# Patient Record
Sex: Female | Born: 1985 | Race: White | Hispanic: No | Marital: Married | Smoking: Never smoker
Health system: Southern US, Community
[De-identification: ages and names within clinical notes are randomized; demographics above are authoritative.]

## PROBLEM LIST (undated history)

## (undated) DIAGNOSIS — N83209 Unspecified ovarian cyst, unspecified side: Secondary | ICD-10-CM

## (undated) DIAGNOSIS — F419 Anxiety disorder, unspecified: Secondary | ICD-10-CM

## (undated) DIAGNOSIS — R51 Headache: Secondary | ICD-10-CM

## (undated) DIAGNOSIS — F988 Other specified behavioral and emotional disorders with onset usually occurring in childhood and adolescence: Secondary | ICD-10-CM

## (undated) DIAGNOSIS — L708 Other acne: Secondary | ICD-10-CM

## (undated) DIAGNOSIS — Z87442 Personal history of urinary calculi: Secondary | ICD-10-CM

## (undated) HISTORY — DX: Other specified behavioral and emotional disorders with onset usually occurring in childhood and adolescence: F98.8

## (undated) HISTORY — DX: Other acne: L70.8

## (undated) HISTORY — DX: Unspecified ovarian cyst, unspecified side: N83.209

## (undated) HISTORY — PX: MOLE REMOVAL: SHX2046

## (undated) HISTORY — DX: Personal history of urinary calculi: Z87.442

## (undated) HISTORY — DX: Headache: R51

## (undated) HISTORY — PX: ABDOMINAL HYSTERECTOMY: SHX81

---
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
## 2005-01-12 ENCOUNTER — Ambulatory Visit: Payer: Self-pay | Admitting: Internal Medicine

## 2005-01-26 ENCOUNTER — Ambulatory Visit: Payer: Self-pay | Admitting: Internal Medicine

## 2005-04-23 ENCOUNTER — Ambulatory Visit: Payer: Self-pay | Admitting: Obstetrics and Gynecology

## 2005-07-19 ENCOUNTER — Ambulatory Visit: Payer: Self-pay | Admitting: Family Medicine

## 2005-08-12 HISTORY — PX: APPENDECTOMY: SHX54

## 2005-09-02 ENCOUNTER — Encounter (INDEPENDENT_AMBULATORY_CARE_PROVIDER_SITE_OTHER): Payer: Self-pay | Admitting: Specialist

## 2005-09-02 ENCOUNTER — Inpatient Hospital Stay (HOSPITAL_COMMUNITY): Admission: EM | Admit: 2005-09-02 | Discharge: 2005-09-03 | Payer: Self-pay | Admitting: Family Medicine

## 2005-11-04 ENCOUNTER — Ambulatory Visit: Payer: Self-pay | Admitting: Family Medicine

## 2006-03-21 ENCOUNTER — Ambulatory Visit: Payer: Self-pay | Admitting: Family Medicine

## 2006-06-24 ENCOUNTER — Ambulatory Visit: Payer: Self-pay | Admitting: Family Medicine

## 2006-06-27 ENCOUNTER — Ambulatory Visit: Payer: Self-pay | Admitting: Family Medicine

## 2006-06-27 LAB — CONVERTED CEMR LAB
BUN: 13 mg/dL (ref 6–23)
Basophils Absolute: 0 10*3/uL (ref 0.0–0.1)
Basophils Relative: 0.6 % (ref 0.0–1.0)
Creatinine, Ser: 1 mg/dL (ref 0.4–1.2)
Eosinophil percent: 0.5 % (ref 0.0–5.0)
HCT: 43.1 % (ref 36.0–46.0)
Hemoglobin: 14.7 g/dL (ref 12.0–15.0)
Lymphocytes Relative: 35.8 % (ref 12.0–46.0)
MCHC: 34.1 g/dL (ref 30.0–36.0)
MCV: 93.6 fL (ref 78.0–100.0)
Monocytes Absolute: 0.4 10*3/uL (ref 0.2–0.7)
Monocytes Relative: 5.7 % (ref 3.0–11.0)
Neutro Abs: 4.5 10*3/uL (ref 1.4–7.7)
Neutrophils Relative %: 57.4 % (ref 43.0–77.0)
Platelets: 185 10*3/uL (ref 150–400)
RBC: 4.6 M/uL (ref 3.87–5.11)
RDW: 11.4 % — ABNORMAL LOW (ref 11.5–14.6)
WBC: 7.7 10*3/uL (ref 4.5–10.5)

## 2006-07-07 ENCOUNTER — Ambulatory Visit: Payer: Self-pay | Admitting: Family Medicine

## 2006-08-08 ENCOUNTER — Ambulatory Visit: Payer: Self-pay | Admitting: Family Medicine

## 2006-09-09 ENCOUNTER — Emergency Department (HOSPITAL_COMMUNITY): Admission: EM | Admit: 2006-09-09 | Discharge: 2006-09-09 | Payer: Self-pay | Admitting: Emergency Medicine

## 2006-11-21 ENCOUNTER — Encounter: Payer: Self-pay | Admitting: Family Medicine

## 2006-11-21 DIAGNOSIS — N83209 Unspecified ovarian cyst, unspecified side: Secondary | ICD-10-CM

## 2006-11-21 DIAGNOSIS — L708 Other acne: Secondary | ICD-10-CM | POA: Insufficient documentation

## 2006-11-21 DIAGNOSIS — J309 Allergic rhinitis, unspecified: Secondary | ICD-10-CM | POA: Insufficient documentation

## 2006-11-21 DIAGNOSIS — Z87442 Personal history of urinary calculi: Secondary | ICD-10-CM

## 2006-11-24 ENCOUNTER — Ambulatory Visit: Payer: Self-pay | Admitting: Family Medicine

## 2006-11-24 DIAGNOSIS — F988 Other specified behavioral and emotional disorders with onset usually occurring in childhood and adolescence: Secondary | ICD-10-CM | POA: Insufficient documentation

## 2007-01-05 ENCOUNTER — Ambulatory Visit: Payer: Self-pay | Admitting: Family Medicine

## 2007-03-08 ENCOUNTER — Ambulatory Visit: Payer: Self-pay | Admitting: Family Medicine

## 2007-04-13 ENCOUNTER — Telehealth (INDEPENDENT_AMBULATORY_CARE_PROVIDER_SITE_OTHER): Payer: Self-pay | Admitting: *Deleted

## 2007-05-05 ENCOUNTER — Ambulatory Visit: Payer: Self-pay | Admitting: Family Medicine

## 2008-06-14 LAB — CONVERTED CEMR LAB: Pap Smear: NORMAL

## 2008-09-12 HISTORY — PX: DILATION AND CURETTAGE OF UTERUS: SHX78

## 2008-09-16 ENCOUNTER — Observation Stay: Payer: Self-pay | Admitting: Obstetrics and Gynecology

## 2008-10-28 ENCOUNTER — Ambulatory Visit: Payer: Self-pay | Admitting: Family Medicine

## 2008-10-29 LAB — CONVERTED CEMR LAB
ALT: 9 units/L (ref 0–35)
AST: 15 units/L (ref 0–37)
Albumin: 4.4 g/dL (ref 3.5–5.2)
Alkaline Phosphatase: 54 units/L (ref 39–117)
BUN: 11 mg/dL (ref 6–23)
Basophils Absolute: 0 10*3/uL (ref 0.0–0.1)
Basophils Relative: 0.5 % (ref 0.0–3.0)
Bilirubin, Direct: 0 mg/dL (ref 0.0–0.3)
CO2: 30 meq/L (ref 19–32)
Calcium: 9.5 mg/dL (ref 8.4–10.5)
Chloride: 111 meq/L (ref 96–112)
Cholesterol: 153 mg/dL (ref 0–200)
Creatinine, Ser: 0.9 mg/dL (ref 0.4–1.2)
Eosinophils Absolute: 0.1 10*3/uL (ref 0.0–0.7)
Eosinophils Relative: 0.8 % (ref 0.0–5.0)
GFR calc non Af Amer: 82.23 mL/min (ref >60)
Glucose, Bld: 84 mg/dL (ref 70–99)
HCT: 42.2 % (ref 36.0–46.0)
HDL: 81.1 mg/dL (ref >39.00)
Hemoglobin: 14.8 g/dL (ref 12.0–15.0)
LDL Cholesterol: 65 mg/dL (ref 0–99)
Lymphocytes Relative: 34.5 % (ref 12.0–46.0)
Lymphs Abs: 2.3 10*3/uL (ref 0.7–4.0)
MCHC: 35.1 g/dL (ref 30.0–36.0)
MCV: 94.8 fL (ref 78.0–100.0)
Monocytes Absolute: 0.3 10*3/uL (ref 0.1–1.0)
Monocytes Relative: 4.7 % (ref 3.0–12.0)
Neutro Abs: 3.9 10*3/uL (ref 1.4–7.7)
Neutrophils Relative %: 59.5 % (ref 43.0–77.0)
Platelets: 183 10*3/uL (ref 150.0–400.0)
Potassium: 4.3 meq/L (ref 3.5–5.1)
RBC: 4.45 M/uL (ref 3.87–5.11)
RDW: 11.3 % — ABNORMAL LOW (ref 11.5–14.6)
Sodium: 144 meq/L (ref 135–145)
TSH: 1.61 microintl units/mL (ref 0.35–5.50)
Total Bilirubin: 0.7 mg/dL (ref 0.3–1.2)
Total CHOL/HDL Ratio: 2
Total Protein: 7.4 g/dL (ref 6.0–8.3)
Triglycerides: 36 mg/dL (ref 0.0–149.0)
VLDL: 7.2 mg/dL (ref 0.0–40.0)
WBC: 6.6 10*3/uL (ref 4.5–10.5)

## 2008-10-31 ENCOUNTER — Telehealth: Payer: Self-pay | Admitting: Family Medicine

## 2008-11-06 ENCOUNTER — Encounter: Payer: Self-pay | Admitting: Family Medicine

## 2008-11-06 ENCOUNTER — Ambulatory Visit: Payer: Self-pay | Admitting: Psychiatry

## 2008-11-21 ENCOUNTER — Ambulatory Visit: Payer: Self-pay | Admitting: Psychiatry

## 2009-05-26 ENCOUNTER — Observation Stay: Payer: Self-pay | Admitting: Obstetrics and Gynecology

## 2009-06-10 ENCOUNTER — Observation Stay: Payer: Self-pay | Admitting: Obstetrics and Gynecology

## 2009-07-07 ENCOUNTER — Observation Stay: Payer: Self-pay | Admitting: Obstetrics and Gynecology

## 2009-07-15 ENCOUNTER — Observation Stay: Payer: Self-pay | Admitting: Obstetrics and Gynecology

## 2009-08-18 ENCOUNTER — Observation Stay: Payer: Self-pay

## 2009-08-20 ENCOUNTER — Observation Stay: Payer: Self-pay | Admitting: Obstetrics and Gynecology

## 2009-09-09 ENCOUNTER — Inpatient Hospital Stay: Payer: Self-pay | Admitting: Obstetrics and Gynecology

## 2009-12-23 ENCOUNTER — Ambulatory Visit: Payer: Self-pay | Admitting: Family Medicine

## 2010-02-18 ENCOUNTER — Ambulatory Visit: Payer: Self-pay | Admitting: Family Medicine

## 2010-02-18 DIAGNOSIS — R519 Headache, unspecified: Secondary | ICD-10-CM | POA: Insufficient documentation

## 2010-02-18 DIAGNOSIS — R51 Headache: Secondary | ICD-10-CM | POA: Insufficient documentation

## 2010-02-18 DIAGNOSIS — M542 Cervicalgia: Secondary | ICD-10-CM

## 2010-03-31 ENCOUNTER — Telehealth: Payer: Self-pay | Admitting: Family Medicine

## 2010-06-02 ENCOUNTER — Telehealth: Payer: Self-pay | Admitting: Family Medicine

## 2010-07-06 ENCOUNTER — Telehealth: Payer: Self-pay | Admitting: Family Medicine

## 2010-07-14 NOTE — Assessment & Plan Note (Signed)
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 Sudafed 2)  * Allegra D  Past History:  Past Medical History: Last updated: Nov 21, 2008 Allergic rhinitis Nephrolithiasis, hx of ADD endometriosis    gyn - Dr Logan Bores [facility])  Past Surgical History: Last updated: November 21, 2008 Appendectomy (08/2005) Mole removal - ? 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 Eyes:  Denies blurring. CV:  Denies chest pain or discomfort, palpitations, shortness of breath with exertion, and swelling of feet. Resp:  Denies cough and shortness of breath. GI:  Denies abdominal pain, change in bowel habits, and nausea. Derm:  Denies rash. Neuro:  Complains of difficulty with concentration; denies disturbances in coordination, falling down, headaches, memory loss, numbness, poor balance, and tingling. Psych:  Denies anxiety, depression, panic attacks, and sense of great danger. Endo:  Denies cold intolerance, excessive thirst, excessive urination, and heat intolerance. Heme:  Denies abnormal bruising and bleeding.  Physical Exam  General:  Well-developed,well-nourished,in no acute distress; alert,appropriate and cooperative throughout examination Head:  normocephalic, atraumatic, and no abnormalities observed.   Eyes:  vision grossly intact, pupils equal, pupils round, and pupils reactive to light.  no conjunctival pallor, injection or icterus  Nose:  no nasal discharge.   Mouth:  pharynx pink and moist.   Neck:  supple with full rom and no masses or thyromegally, no JVD or carotid bruit  Chest Wall:  No deformities, masses, or tenderness noted. Lungs:  Normal respiratory effort, chest expands symmetrically. Lungs are clear to auscultation, no crackles or wheezes. Heart:  Normal rate and regular rhythm. S1 and S2 normal without gallop, murmur, click, rub or other extra sounds. Msk:  No deformity or scoliosis noted of thoracic or lumbar spine.  no acute joint changes, full rom of all joints  Extremities:  No clubbing, cyanosis, edema, or deformity noted with normal full range of motion of all joints.   Neurologic:  sensation intact to light touch, gait normal, and DTRs  symmetrical and normal.  no tremor  Skin:  Intact without suspicious lesions or rashes Cervical Nodes:  No lymphadenopathy noted Psych:  cheerful affect - talkative  good eye contact not seemingly overactive    Impression & Recommendations:  Problem # 1:  ADD (ICD-314.00) Assessment Deteriorated ADD with some problems at work with innatentiveness/ productivity and multitasking  will start back on daytime adderall 20 mg xr - and update  adv to call if side eff or not eff she is familiar with process to refil/ etc   Complete Medication List: 1)  Adderall Xr 20 Mg Xr24h-cap (Amphetamine-dextroamphetamine) .Marland Kitchen.. 1 by mouth once daily  Patient Instructions: 1)  take adderall on work days- update me if side effects or problems 2)  call if not effective  Prescriptions: ADDERALL XR 20 MG XR24H-CAP (AMPHETAMINE-DEXTROAMPHETAMINE) 1 by mouth once daily  #30 x 0   Entered and Authorized by:   Judith Part MD   Signed by:   Judith Part MD on 12/23/2009   Method used:   Print then Give to Patient   RxID:   [id number]   Prior Medications (reviewed today): None Current Allergies (reviewed today): ! SUDAFED Joyce Copa D

## 2010-07-14 NOTE — Assessment & Plan Note (Signed)
Summary: ACU FOR HURT NECK CAN'T TURN HEAD TO SIDE/JRR   Vital Signs:  Patient profile:   25 year old female Height:      63.5 inches Weight:      149 pounds BMI:     26.07 Temp:     98.7 degrees F oral Pulse rate:   80 / minute Pulse rhythm:   regular BP sitting:   102 / 72  (left arm) Cuff size:   regular  Vitals Entered By: Linde Gillis CMA Duncan Dull) (February 18, 2010 12:17 PM) CC: pain in neck, hurts to turn her head, Headache   History of Present Illness: 25 year old female:  Neck and moving across.   neck:posterior neck pain, over the weekend, with some restriction of lateral rotational movements on the left. This is improved over the last few days, and the patient has been taking some Aleve. She does have a 47-month-old baby. She is not breast-feeding. no hx of neck pronbs  ? migraines  Headache HPI:      The patient comes in for an acute, first time visit for headaches.  The current headache started approximately 02/12/2010.  She notes 5+ previous headaches similar to the current one.  The headaches will last anywhere from 2 hours to 3 days at a time.  She has approximately 5+ headaches per month.  Headaches have been occurring since age 20.  There is no family history of migraine headaches.        The location of the headaches are occipital.  The headaches are associated with nausea, photophobia, and phonophobia.        Positive alarm features include change in frequency from prior H/A's, change in severity from prior H/A's, and change in features from prior H/A's.  The patient denies H/A's with Valsalva (cough/sneeze), mylagia, fever, malaise, weight loss, scalp tenderness, jaw claudication, focal neurologic symptoms, confusion, seizures, and impaired level of consciousness.        Allergies: 1)  ! Sudafed 2)  * Allegra D  Past History:  Past medical, surgical, family and social histories (including risk factors) reviewed, and no changes noted (except as noted  below).  Past Medical History: Reviewed history from 10/28/2008 and no changes required. Allergic rhinitis Nephrolithiasis, hx of ADD endometriosis    gyn - Dr Logan Bores [facility])  Past Surgical History: Reviewed history from 10/28/2008 and no changes required. Appendectomy (08/2005) Mole removal - ? atypical D and C 4/10 - miscarriage laparoscopy pelvic 06- endometriosis   Family History: Reviewed history from 10/28/2008 and no changes required. Father: Colon polyps,died from brain tumor Mother:depression Siblings: 1 brother, 1 sister  Social History: Reviewed history from 12/23/2009 and no changes required. 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 Otherwise the pertinent positives of the ROS are noted above.    Physical Exam  General:  GEN: Well-developed,well-nourished,in no acute distress; alert,appropriate and cooperative throughout examination HEENT: Normocephalic and atraumatic without obvious abnormalities. No apparent alopecia or balding. Ears, externally no deformities PULM: Breathing comfortably in no respiratory distress EXT: No clubbing, cyanosis, or edema PSYCH: Normally interactive. Cooperative during the interview. Pleasant. Friendly and conversant.  Not anxious or depressed appearing. Normal, full affect.  Msk:  cervical spine: Nontender along the spinous processes.  Some slight degree of restriction of motion with moving in the left work rotational motion. Mild paracervical muscular tenderness. No trapezius tenderness.  Scapular dyskinesis noted.  C5-T1 is intact.   Detailed Neurologic  Exam  Speech:    Speech is normal; fluent and spontaneous with normal comprehension Cognition:    The patient is oriented to person, place, and time; memory intact; language fluent; normal attention, concentration, and fund of knowledge Cranial Nerves:    The pupils are equal, round, and reactive to light. The fundi are normal and spontaneous venous pulsations are present. Visual fields are full to finger confrontation. Extraocular movements are intact. Trigeminal sensation is intact and the muscles of mastication are normal. The face is symmetric. The palate elevates in the midline. Voice is normal. Shoulder shrug is normal. The tongue has normal motion without fasciculations.  Coordination:    Normal finger to nose and heel to shin. Normal rapid alternating movements.  Gait:    Heel-toe and tandem gait are normal.  Light Touch:    Normal light touch sensation in upper and lower extremities.    Impression & Recommendations:  Problem # 1:  NECK PAIN (ICD-723.1) Assessment New cervicalgia, improving. Reviewed gentle range of motion protocols and some basic strengthening. Heat, massage. Flexeril at night. Continue with Aleve at home.  Headaches are acceptable. May be related to neck. Given photophobia and phonophobia, could have onset of migraine. For now with headache, Tylenol and NSAIDs with caffeine.  Her updated medication list for this problem includes:    Cyclobenzaprine Hcl 10 Mg Tabs (Cyclobenzaprine hcl) .Marland Kitchen... 1/2 to 1 tab by mouth at bedtime    Butalbital-apap-caffeine 50-325-40 Mg Tabs (Butalbital-apap-caffeine) .Marland Kitchen... 1-2 tabs by mouth as needed headache q 4 hours (max 6 / 24 hours)  Problem # 2:  HEADACHE (ICD-784.0) Assessment: New  Her updated medication list for this problem includes:    Butalbital-apap-caffeine 50-325-40 Mg Tabs (Butalbital-apap-caffeine) .Marland Kitchen... 1-2 tabs by mouth as needed headache q 4 hours (max 6 / 24 hours)  Complete Medication List: 1)  Adderall Xr  20 Mg Xr24h-cap (Amphetamine-dextroamphetamine) .Marland Kitchen.. 1 by mouth once daily 2)  Cyclobenzaprine Hcl 10 Mg Tabs (Cyclobenzaprine hcl) .... 1/2 to 1 tab by mouth at bedtime 3)  Butalbital-apap-caffeine 50-325-40 Mg Tabs (Butalbital-apap-caffeine) .Marland Kitchen.. 1-2 tabs by mouth as needed headache q 4 hours (max 6 / 24 hours) Prescriptions: BUTALBITAL-APAP-CAFFEINE 50-325-40 MG TABS (BUTALBITAL-APAP-CAFFEINE) 1-2 tabs by mouth as needed headache q 4 hours (max 6 / 24 hours)  #30 x 0   Entered and Authorized by:   Hannah Beat MD   Signed by:   Hannah Beat MD on 02/18/2010   Method used:   Print then Give to Patient   RxID:   [id number] CYCLOBENZAPRINE HCL 10 MG TABS (CYCLOBENZAPRINE HCL) 1/2 to 1 tab by mouth at bedtime  #30 x 0   Entered and Authorized by:   Hannah Beat MD   Signed by:   Hannah Beat MD on 02/18/2010   Method used:   Print then Give to Patient   RxID:   [id number]   Current Allergies (reviewed today): ! SUDAFED Joyce Copa D

## 2010-07-14 NOTE — Progress Notes (Signed)
Summary: Adderall Refill  Phone Note Refill Request Call back at Home Phone [phone] Message from:  Patient on March 31, 2010 11:08 AM  Refills Requested: Medication #1:  ADDERALL XR 20 MG XR24H-CAP 1 by mouth once daily Patient called and requested written script for refill.    Method Requested: Pick up at Office Initial call taken by: Selena Batten Dance CMA Duncan Dull),  March 31, 2010 11:08 AM  Follow-up for Phone Call        printed in put in nurse in box for pickup   Follow-up by: Judith Part MD,  March 31, 2010 1:16 PM  Additional Follow-up for Phone Call Additional follow up Details #1::        Patient notified as instructed by telephone. Prescription left at front desk. Lewanda Rife LPN  March 31, 2010 2:28 PM     Prescriptions: ADDERALL XR 20 MG XR24H-CAP (AMPHETAMINE-DEXTROAMPHETAMINE) 1 by mouth once daily  #30 x 0   Entered and Authorized by:   Judith Part MD   Signed by:   Judith Part MD on 03/31/2010   Method used:   Print then Give to Patient   RxID:   [id number]

## 2010-07-14 NOTE — Miscellaneous (Signed)
Summary: Controlled Substances Contract  Controlled Substances Contract   Imported By: Lester [place] 12/25/2009 11:57:06  _____________________________________________________________________  External Attachment:    Type:   Image     Comment:   External Document

## 2010-07-16 NOTE — Progress Notes (Signed)
Summary: refill request for adderall  Phone Note Refill Request Call back at Home Phone [phone] Message from:  Patient  Refills Requested: Medication #1:  ADDERALL XR 20 MG XR24H-CAP 1 by mouth once daily Pt is asking if dose can be increased.  She says some days she cant tell that she has taken it.  States dose was recently decreased.  Initial call taken by: Lowella Petties CMA, AAMA,  July 06, 2010 2:36 PM  Follow-up for Phone Call        what dose was she previously on that worked better for her ? Follow-up by: Judith Part MD,  July 06, 2010 4:18 PM  Additional Follow-up for Phone Call Additional follow up Details #1::        Left message for patient to call back. Lewanda Rife LPN  July 06, 2010 4:27 PM   Patient notified as instructed by telephone. Pt stated she thinks she took the Adderall XR 30mg  takinig one daily.Please advise. Lewanda Rife LPN  July 07, 2010 8:25 AM     Additional Follow-up for Phone Call Additional follow up Details #2::    that is fine - let me know if not improved with it  printed in put in nurse in box for pickup  Follow-up by: Judith Part MD,  July 07, 2010 8:44 AM  Additional Follow-up for Phone Call Additional follow up Details #3:: Details for Additional Follow-up Action Taken: Left message for patient to call back. Prescription left at front desk. Lewanda Rife LPN  July 07, 2010 10:38 AM   Patient notified as instructed by telephone. Lewanda Rife LPN  July 07, 2010 1:34 PM   New/Updated Medications: ADDERALL XR 30 MG XR24H-CAP (AMPHETAMINE-DEXTROAMPHETAMINE) 1 by mouth once daily Prescriptions: ADDERALL XR 30 MG XR24H-CAP (AMPHETAMINE-DEXTROAMPHETAMINE) 1 by mouth once daily  #30 x 0   Entered and Authorized by:   Judith Part MD   Signed by:   Judith Part MD on 07/07/2010   Method used:   Print then Give to Patient   RxID:   [id number]

## 2010-07-16 NOTE — Progress Notes (Signed)
Summary: refill request for adderall  Phone Note Refill Request Call back at Home Phone [phone] Message from:  Patient  Refills Requested: Medication #1:  ADDERALL XR 20 MG XR24H-CAP 1 by mouth once daily Please call pt when ready.  Initial call taken by: Lowella Petties CMA, AAMA,  June 02, 2010 11:32 AM  Follow-up for Phone Call        printed in put in nurse in box for pickup  Follow-up by: Judith Part MD,  June 02, 2010 12:57 PM  Additional Follow-up for Phone Call Additional follow up Details #1::        Patient notified that rx is up front and ready for pickup.  Additional Follow-up by: Sydell Axon LPN,  June 02, 2010 2:35 PM    Prescriptions: ADDERALL XR 20 MG XR24H-CAP (AMPHETAMINE-DEXTROAMPHETAMINE) 1 by mouth once daily  #30 x 0   Entered and Authorized by:   Judith Part MD   Signed by:   Judith Part MD on 06/02/2010   Method used:   Print then Give to Patient   RxID:   [id number]

## 2010-08-13 ENCOUNTER — Ambulatory Visit: Payer: Self-pay | Admitting: Anesthesiology

## 2010-08-21 ENCOUNTER — Ambulatory Visit: Payer: Self-pay | Admitting: Obstetrics and Gynecology

## 2010-09-03 ENCOUNTER — Telehealth: Payer: Self-pay | Admitting: *Deleted

## 2010-09-03 NOTE — Telephone Encounter (Signed)
Needs refill on adderall. Please call patient when rx is ready.

## 2010-09-04 MED ORDER — AMPHETAMINE-DEXTROAMPHET ER 30 MG PO CP24: 30.0000 mg | ORAL_CAPSULE | ORAL | Status: DC

## 2010-09-04 NOTE — Telephone Encounter (Signed)
Px printed for pick up in IN box  

## 2010-09-04 NOTE — Telephone Encounter (Signed)
Patient notified as instructed by telephone. Prescription left at front desk.  

## 2010-10-30 NOTE — H&P (Signed)
NAMESHEVY, YANEY           ACCOUNT NO.:  [id number]   MEDICAL RECORD NO.:  [id number]          PATIENT TYPE:  INP   LOCATION:  5729                         FACILITY:  [facility]   PHYSICIAN:  Wilmon Arms. Corliss Skains, M.D. DATE OF BIRTH:  January 21, 1986   DATE OF ADMISSION:  09/01/2005  DATE OF DISCHARGE:                                HISTORY & PHYSICAL   CHIEF COMPLAINT:  Right lower quadrant pain.   HISTORY OF PRESENT ILLNESS:  The patient is a 25 year old female with a past  medical history significant for endometriosis and ovarian cysts who presents  with 12 hours of acute onset of generalized lower abdominal pain. The pain  has now become localized to the right lower quadrant. She does have some  nausea but no vomiting. She reports low-grade temperature around 100 and has  no appetite. Her last meal was around noon yesterday. She presented to  Urgent Care and was transferred to the emergency department for a CT scan  for suspected appendicitis. A CT scan confirmed a retrocecal appendix with  inflammation but no evidence of abscess or free fluid. The patient was given  one dose of intravenous pain medication with significant relief of her  symptoms.   PAST MEDICAL HISTORY:  Endometriosis, ovarian cysts.   PAST SURGICAL HISTORY:  Laparoscopy in November 2006 by Dr. Logan Bores,  gynecologist in [address].   HOME MEDICATIONS:  None.   ALLERGIES:  1.  CODEINE, but the patient took Vicodin after her laparoscopy with no      difficulty.  2.  PSEUDOEPHEDRINE which gives her a fast heart rate.   PHYSICAL EXAMINATION:  VITAL SIGNS:  Temperature 100.0, blood pressure  122/83, pulse 88, respirations 20, 99% on room air.  GENERAL:  This is a well-developed, well-nourished female in no apparent  distress.  HEENT:  EOMI, sclerae anicteric.  NECK:  No masses or thyromegaly.  LUNGS:  Clear to auscultation bilaterally. Normal respiratory effort.  HEART:  Regular rate and rhythm, no murmurs.  ABDOMEN:  Soft, good bowel sounds. Tender in the right lower quadrant. No  palpable masses. There is some voluntary guarding.  PELVIC:  Examination was performed by the emergency department physician and  shows some right-sided tenderness.  SKIN:  Warm and dry. No sign of jaundice.   LABORATORY DATA:  White count is 11.7, hemoglobin 14.4.   IMPRESSION:  Acute retrocecal appendicitis.   PLAN:  Will take the patient to the operating room urgently for a  laparoscopic appendectomy. We discussed the benefits and risks of the  procedure including the possible need for an open procedure. The patient and  her mother understand and wish to proceed.      Wilmon Arms. Tsuei, M.D.  Electronically Signed     MKT/MEDQ  D:  09/02/2005  T:  09/02/2005  Job:  [id number]

## 2010-10-30 NOTE — Op Note (Signed)
NAMEPORSHEA, JANOWSKI           ACCOUNT NO.:  [id number]   MEDICAL RECORD NO.:  [id number]          PATIENT TYPE:  INP   LOCATION:  2550                         FACILITY:  [facility]   PHYSICIAN:  Wilmon Arms. Corliss Skains, M.D. DATE OF BIRTH:  05/07/1986   DATE OF PROCEDURE:  09/02/2005  DATE OF DISCHARGE:                                 OPERATIVE REPORT   PREOP DIAGNOSIS:  Acute appendicitis.   POSTOP DIAGNOSIS:  Acute appendicitis.   PROCEDURE PERFORMED:  Laparoscopic appendectomy   SURGEON:  Wilmon Arms. Tsuei, M.D.   ANESTHESIA:  General endotracheal.   INDICATIONS:  The patient is a 25 year old female who presents with 12 hours  of acute right lower quadrant pain. She was evaluated in the emergency  department and had a CT scan showing acute appendicitis and a retrocecal  appendix. There was no evidence of abscess.  Surgery was then consulted. I examined the patient and recommended immediate  laparoscopic appendectomy.   DESCRIPTION OF PROCEDURE:  The patient brought to the operating room, placed  in the supine position on the operating table. After an adequate level of  general endotracheal anesthesia was obtained, a Foley catheter was placed in  a sterile technique. The patient's abdomen was prepped with Betadine and  draped in a sterile fashion. A time-out was taken to ensure the proper  patient and the proper procedure. The patient's  umbilicus was infiltrated  with 1/4% Marcaine; and a transverse incision was made.   Dissection was carried down to the fascia. The fascia was opened vertically.  The peritoneal cavity was bluntly entered. A stay suture of #0 Vicryl was  placed in a pursestring fashion. The Hasson cannula was inserted and secured  with a stay suture. Pneumoperitoneum was obtained by insufflating CO2,  maintaining a maximum pressure of 15 mmHg. The laparoscope was inserted and  a quick visual examination showed no purulence anywhere in the abdomen. A 10-  mm port  was placed in the upper midline under direct vision. A 5-mm port was  placed in the left lower quadrant. The scope was switched out for a 30-  degree, 10-mm scope and inserted through the upper port.   The patient was rotated into the Trendelenburg position, tilted slightly to  her left. The cecum was fairly mobile. Babcock clamp was used to pull the  cecum medially. We were able to identify the tip of the appendix. This  appeared mildly inflamed. This was grasped with a Babcock clamp and  elevated. There were some flimsy adhesions to the appendix. These were taken  down with the harmonic scalpel. We continued mobilizing the appendix toward  its base. The mesoappendix was divided with the harmonic scalpel. Once we  had the appendix dissected free all the way down to its base an Endo-GIA  stapler was used to transect the base of appendix. The appendix was then  placed in an EndoCatch sac and removed through the umbilical port. We  reinspected the staple line which was noted to be intact with no sign of  bleeding. The right lower quadrant was irrigated and suctioned dry. No other  inflammation  was noted.   The ports were removed under direct vision. Pneumoperitoneum was released,  and the stay suture was tied down to close the umbilical fascia. Then 4-0  Monocryl was used to close skin in subcuticular fashion. Steri-Strips and  clean dressings were applied. The patient was extubated and brought to  recovery room in stable condition. Her Foley catheter was removed prior to  leaving the operating room.      Wilmon Arms. Tsuei, M.D.  Electronically Signed     MKT/MEDQ  D:  09/02/2005  T:  09/03/2005  Job:  [id number]

## 2010-10-30 NOTE — Discharge Summary (Signed)
[name], [name]           ACCOUNT NO.:  [id number]   MEDICAL RECORD NO.:  [id number]          PATIENT TYPE:  INP   LOCATION:  5729                         FACILITY:  [facility]   PHYSICIAN:  Sandria Bales. Ezzard Standing, M.D.  DATE OF BIRTH:  Jul 17, 1985   DATE OF ADMISSION:  09/01/2005  DATE OF DISCHARGE:  09/03/2005                                 DISCHARGE SUMMARY   REASON FOR ADMISSION:  Ms. [name] is a 25 year old, female patient with  history of endometriosis and ovarian cyst who presented with 12 hours of  acute-onset of generalized lower abdominal pain finally localized to the  right lower quadrant, nausea with no vomiting, low-grade temperature of 100  and anorexia.  She presented initially to Urgent Care and transferred to the  ER at [facility] so a CT scan could be obtained because the differential was  for appendicitis.  The CT scan did reveal a retrocecal appendix with  significant inflammation, no abscess or free fluid.   PHYSICAL EXAMINATION:  VITAL SIGNS:  On exam, the patient's temperature was  100, otherwise vital signs were stable.  ABDOMEN:  Soft, but tender in the right lower quadrant with voluntary  guarding.   LABORATORY DATA AND X-RAY FINDINGS:  Her white count was 11,700, hemoglobin  14.4.   ASSESSMENT:  The patient was admitted with a diagnosis of acute retrocecal  appendicitis and was set for emergent laparoscopic appendectomy.  The  patient was taken to the operating room where she underwent a laparoscopic  appendectomy at 3 a.m. for acute appendicitis and was set to go to the  surgical floor to recover postoperatively.   HOSPITAL COURSE:  The patient was admitted on September 02, 2005, where she was  recovering from her laparoscopic appendectomy when I initially evaluated her  early that morning.  About 5 hours postop, she was still somewhat sedated  and nauseated from her anesthesia.  She became nauseated with an oral diet,  so she was kept in the hospital an  additional 24 hours.  She had hypoactive  bowel sounds, but otherwise the abdomen was benign and incisions were clean,  dry and intact.   On September 03, 2005, which was officially postop day #1, the patient looked  much better.  She was more alert.  She was tolerating Vicodin for pain.  She  was tolerating a diet.  Her abdomen was soft and benign and she was deemed  appropriate for discharge home.   DISCHARGE DIAGNOSES:  1.  Acute appendicitis, status post laparoscopic appendectomy for same.  2.  History of endometriosis and ovarian cyst.   DISCHARGE MEDICATIONS:  1.  Continue medications as prior to admission.  2.  Vicodin 5/500 one every 6 hours as needed for pain.  3.  Over-the-counter Motrin as needed for pain.   DIET:  No restrictions.   ACTIVITY:  Increase activity slowly.  May shower.  No driving x2 weeks.  No  lifting x2 weeks.   FOLLOW UP:  She will need to follow up in the office at April 9, at 10:45  a.m.  You need to call if you have  any drainage or redness of the wounds.  Call if you have fever by mouth more than 100.5.      [name], N.P.      Sandria Bales. Ezzard Standing, M.D.  Electronically Signed    ALE/MEDQ  D:  10/27/2005  T:  10/28/2005  Job:  [id number]

## 2010-11-06 ENCOUNTER — Other Ambulatory Visit: Payer: Self-pay | Admitting: *Deleted

## 2010-11-06 MED ORDER — AMPHETAMINE-DEXTROAMPHET ER 30 MG PO CP24: 30.0000 mg | ORAL_CAPSULE | ORAL | Status: DC

## 2010-11-06 NOTE — Telephone Encounter (Signed)
Px printed for pick up in IN box  

## 2010-11-06 NOTE — Telephone Encounter (Signed)
Patient notified as instructed by telephone. Prescription left at front desk.  

## 2010-11-06 NOTE — Telephone Encounter (Signed)
Please call pt when ready.

## 2010-12-03 ENCOUNTER — Other Ambulatory Visit: Payer: Self-pay | Admitting: *Deleted

## 2010-12-03 MED ORDER — AMPHETAMINE-DEXTROAMPHET ER 30 MG PO CP24: 30.0000 mg | ORAL_CAPSULE | ORAL | Status: DC

## 2010-12-03 NOTE — Telephone Encounter (Signed)
Please notify ready to pick up.  Signed and placed in Kim's box.

## 2010-12-04 NOTE — Telephone Encounter (Signed)
Message left notifying patient that Rx was ready. Rx placed up front for pick up.

## 2011-01-06 ENCOUNTER — Other Ambulatory Visit: Payer: Self-pay | Admitting: *Deleted

## 2011-01-06 MED ORDER — AMPHETAMINE-DEXTROAMPHET ER 30 MG PO CP24: 30.0000 mg | ORAL_CAPSULE | ORAL | Status: DC

## 2011-01-06 NOTE — Telephone Encounter (Signed)
Left v/m for pt to call back. Prescription left at front desk.  

## 2011-01-06 NOTE — Telephone Encounter (Signed)
Px printed for pick up in IN box  

## 2011-01-06 NOTE — Telephone Encounter (Signed)
Please call pt when ready.

## 2011-01-07 NOTE — Telephone Encounter (Signed)
Patient notified as instructed by telephone. 

## 2011-02-19 ENCOUNTER — Other Ambulatory Visit: Payer: Self-pay | Admitting: *Deleted

## 2011-02-19 MED ORDER — AMPHETAMINE-DEXTROAMPHET ER 30 MG PO CP24: 30.0000 mg | ORAL_CAPSULE | ORAL | Status: DC

## 2011-02-19 NOTE — Telephone Encounter (Signed)
Please call pt when ready.

## 2011-02-19 NOTE — Telephone Encounter (Signed)
Have refilled x1.  Please notify ready to pick up.  Needs OV with PCP as last one 12/2009.

## 2011-02-19 NOTE — Telephone Encounter (Signed)
Message left notifying patient of Rx and the need to schedule a follow up visit for further refills. Advised her no more refills until she has OV. Rx placed up front for pick up.

## 2011-04-19 ENCOUNTER — Encounter: Payer: Self-pay | Admitting: Family Medicine

## 2011-04-20 ENCOUNTER — Ambulatory Visit (INDEPENDENT_AMBULATORY_CARE_PROVIDER_SITE_OTHER): Payer: PRIVATE HEALTH INSURANCE | Admitting: Family Medicine

## 2011-04-20 ENCOUNTER — Encounter: Payer: Self-pay | Admitting: Family Medicine

## 2011-04-20 VITALS — BP 105/58 | HR 80 | Temp 97.9°F | Ht 63.5 in | Wt 124.2 lb

## 2011-04-20 DIAGNOSIS — F988 Other specified behavioral and emotional disorders with onset usually occurring in childhood and adolescence: Secondary | ICD-10-CM

## 2011-04-20 DIAGNOSIS — R634 Abnormal weight loss: Secondary | ICD-10-CM

## 2011-04-20 MED ORDER — AMPHETAMINE-DEXTROAMPHET ER 30 MG PO CP24: 30.0000 mg | ORAL_CAPSULE | ORAL | Status: DC

## 2011-04-20 NOTE — Progress Notes (Signed)
Subjective:    Patient ID: [name], female    DOB: June 21, 1985, 25 y.o.   MRN: [id number]  HPI  Here for f/u of ADD Has been doing well  Is on adderall xr  30 mg daily  Is working really well - helps her concentrate so much better at work   [employer] is down 25 lb in the past year with bmi of 21  Thinks the adderall has decreased her appetite   During the day- eats breakfast -- bowl of cereal or a biscuit -- or donut - does usually eat before she takes med   Snacks sometimes - very small lunch  Works 8:30-5    Eats supper well - chicken wings/ fries/ eats all / steak/ beans/ potato -- good size dinner   Patient Active Problem List  Diagnoses  . ADD  . ALLERGIC RHINITIS  . OVARIAN CYST  . ACNE VULGARIS  . NECK PAIN  . HEADACHE  . NEPHROLITHIASIS, HX OF   Past Medical History  Diagnosis Date  . Allergic rhinitis   . Personal history of urinary calculi   . ADD (attention deficit disorder)   . Endometriosis   . Other acne   . Headache   . Other and unspecified ovarian cyst    Past Surgical History  Procedure Date  . Appendectomy 3/07  . Mole removal     ? atypical  . Dilation and curettage of uterus 4/10    miscarriage  . Laparoscopy 2006    pelvic--endometriosis   History  Substance Use Topics  . Smoking status: Never Smoker   . Smokeless tobacco: Not on file  . Alcohol Use: Not on file   Family History  Problem Relation Age of Onset  . Colon polyps Father   . Other Father     Brain tumor  . Depression Mother    Allergies  Allergen Reactions  . Allegra-D     REACTION: ? reaction  . Pseudoephedrine     REACTION: reaction not known   Current Outpatient Prescriptions on File Prior to Visit  Medication Sig Dispense Refill  . butalbital-acetaminophen-caffeine (FIORICET WITH CODEINE) 50-325-40-30 MG per capsule Take 1-2 capsules by mouth every 4 (four) hours as needed. Max 6 in 24 hours       . cyclobenzaprine (FLEXERIL) 10 MG tablet Take 5-10 mg by  mouth at bedtime.            Review of Systems Review of Systems  Constitutional: Negative for fever, appetite change, fatigue and unexpected weight change.  Eyes: Negative for pain and visual disturbance.  Respiratory: Negative for cough and shortness of breath.   Cardiovascular: Negative for cp or palpitations    Gastrointestinal: Negative for nausea, diarrhea and constipation.  Genitourinary: Negative for urgency and frequency.  Skin: Negative for pallor or rash   Neurological: Negative for weakness, light-headedness, numbness and headaches.  Hematological: Negative for adenopathy. Does not bruise/bleed easily.  Psychiatric/Behavioral: Negative for dysphoric mood. The patient is not nervous/anxious.          Objective:   Physical Exam  Constitutional: She appears well-developed and well-nourished. No distress.       Slim and well appearing   HENT:  Head: Normocephalic and atraumatic.  Mouth/Throat: Oropharynx is clear and moist. No oropharyngeal exudate.  Eyes: Conjunctivae and EOM are normal. Pupils are equal, round, and reactive to light. No scleral icterus.  Neck: Normal range of motion. Neck supple. No JVD present. No thyromegaly present.  Cardiovascular: Normal rate, regular rhythm, normal heart sounds and intact distal pulses.   Pulmonary/Chest: Effort normal and breath sounds normal. No respiratory distress. She has no wheezes.  Abdominal: Soft. Bowel sounds are normal. She exhibits no distension and no mass. There is no tenderness.  Musculoskeletal: She exhibits no edema and no tenderness.  Lymphadenopathy:    She has no cervical adenopathy.  Neurological: She is alert. She has normal reflexes. She exhibits normal muscle tone. Coordination normal.       No tremor   Skin: Skin is warm and dry. No rash noted. No erythema. No pallor.  Psychiatric: She has a normal mood and affect.          Assessment & Plan:

## 2011-04-20 NOTE — Patient Instructions (Signed)
For now - I'm glad the adderall works well but we need to make sure you do not loose more weight You may need to eat more during the day- even if you are not very hungry Eat breakfast - larger with starch/ fruit / protein every day  Eat 2 snacks during the day like yogurt / protien shake/ peanut butter and crackers Make sure to never skip lunch  Dinner - keep eating well  Bed time snack if hungry  Follow up in 3 months -if any more weight loss --we may need to change your medicine

## 2011-04-20 NOTE — Assessment & Plan Note (Signed)
Experiencing some wt loss/ dec hunger from adderall but it is working very well  Wants to continue it , wt loss is not intentional Outlined a plan for eating to get more calories in consistently Will re check 3 months - if further weight loss will change medicine Refill adderall today

## 2011-04-22 DIAGNOSIS — R634 Abnormal weight loss: Secondary | ICD-10-CM | POA: Insufficient documentation

## 2011-04-22 NOTE — Assessment & Plan Note (Signed)
From adderall and decrease in appetite Disc in detail need for inc calories and plan made for bigger breakfast and dinner with 2 snacks around lunch Will f/u in 3 mo - if any further loss will need to change ADD med

## 2011-06-02 ENCOUNTER — Ambulatory Visit (INDEPENDENT_AMBULATORY_CARE_PROVIDER_SITE_OTHER): Payer: PRIVATE HEALTH INSURANCE | Admitting: Family Medicine

## 2011-06-02 ENCOUNTER — Encounter: Payer: Self-pay | Admitting: Family Medicine

## 2011-06-02 VITALS — BP 88/60 | HR 80 | Temp 98.2°F | Ht 63.5 in | Wt 118.5 lb

## 2011-06-02 DIAGNOSIS — K602 Anal fissure, unspecified: Secondary | ICD-10-CM | POA: Insufficient documentation

## 2011-06-02 MED ORDER — AMPHETAMINE-DEXTROAMPHET ER 30 MG PO CP24: 30.0000 mg | ORAL_CAPSULE | ORAL | Status: DC

## 2011-06-02 NOTE — Progress Notes (Signed)
Subjective:    Patient ID: Margie Billet, female    DOB: 07-Dec-1985, 25 y.o.   MRN: [id number]  HPI Here for blood in stool from 12/9 through 12/14 Has happened before - a few times sporatically  Bright red blood  A fair amount - more than just a streak  ? Wonders about hemorroids   Bowel habits are different the past 6 months  Is more constipated - and no longer has a bm every single day  Sometimes sits as long as 10 minutes - feels like she has to go but just can't  occ it hurts to go- even if not bleeding   No abdominal pain   Patient Active Problem List  Diagnoses  . ADD  . ALLERGIC RHINITIS  . OVARIAN CYST  . ACNE VULGARIS  . NECK PAIN  . HEADACHE  . NEPHROLITHIASIS, HX OF  . Weight loss, unintentional   Past Medical History  Diagnosis Date  . Allergic rhinitis   . Personal history of urinary calculi   . ADD (attention deficit disorder)   . Endometriosis   . Other acne   . Headache   . Other and unspecified ovarian cyst    Past Surgical History  Procedure Date  . Appendectomy 3/07  . Mole removal     ? atypical  . Dilation and curettage of uterus 4/10    miscarriage  . Laparoscopy 2006    pelvic--endometriosis   History  Substance Use Topics  . Smoking status: Never Smoker   . Smokeless tobacco: Not on file  . Alcohol Use: Not on file   Family History  Problem Relation Age of Onset  . Colon polyps Father   . Other Father     Brain tumor  . Depression Mother    Allergies  Allergen Reactions  . Allegra-D     REACTION: ? reaction  . Pseudoephedrine     REACTION: reaction not known   Current Outpatient Prescriptions on File Prior to Visit  Medication Sig Dispense Refill  . amphetamine-dextroamphetamine (ADDERALL XR) 30 MG 24 hr capsule Take 1 capsule (30 mg total) by mouth every morning.  30 capsule  0  . butalbital-acetaminophen-caffeine (FIORICET WITH CODEINE) 50-325-40-30 MG per capsule Take 1-2 capsules by mouth every 4 (four) hours as  needed. Max 6 in 24 hours       . cyclobenzaprine (FLEXERIL) 10 MG tablet Take 5-10 mg by mouth at bedtime.         (note father had colon polyps)     Review of Systems Review of Systems  Constitutional: Negative for fever, appetite change, fatigue and unexpected weight change.  Eyes: Negative for pain and visual disturbance.  Respiratory: Negative for cough and shortness of breath.   Cardiovascular: Negative for cp or palpitations    Gastrointestinal: Negative for nausea, diarrhea and positive for constipation and brb in stool , neg for abd pain or gas  Genitourinary: Negative for urgency and frequency.  Skin: Negative for pallor or rash  , neg for anal itching Neurological: Negative for weakness, light-headedness, numbness and headaches.  Hematological: Negative for adenopathy. Does not bruise/bleed easily.  Psychiatric/Behavioral: Negative for dysphoric mood. The patient is not nervous/anxious.          Objective:   Physical Exam  Constitutional: She appears well-developed and well-nourished. No distress.  HENT:  Head: Normocephalic and atraumatic.  Mouth/Throat: Oropharynx is clear and moist.  Eyes: Conjunctivae and EOM are normal. Pupils are equal, round, and reactive  to light. No scleral icterus.       No conj pallor  Neck: Normal range of motion. Neck supple. No JVD present. No thyromegaly present.  Cardiovascular: Normal rate, regular rhythm and normal heart sounds.   Pulmonary/Chest: Effort normal and breath sounds normal.  Abdominal: Soft. Bowel sounds are normal. She exhibits no distension and no mass. There is no tenderness. There is no rebound and no guarding.  Genitourinary: Rectal exam shows fissure and tenderness. Rectal exam shows no internal hemorrhoid, no mass and anal tone normal. Guaiac positive stool.       Very small superficial anal fissure posteriorly on the R on anoscope   Lymphadenopathy:    She has no cervical adenopathy.  Skin: Skin is warm and dry.  No rash noted. No erythema. No pallor.          Assessment & Plan:

## 2011-06-02 NOTE — Patient Instructions (Signed)
You have a small mild anal fissure that likely caused your bleeding  We want to keep stools soft and avoid straining  Use fiber like citrucel with lots of fluids every day - if not helpful can try miralax over the counter as directed Also a sitz bath may help after bowel movements - see handout I gave you If not improved in 1-2 weeks let me know - there is a medicine we can try - but usually a fissure of this size will get better on its own

## 2011-06-02 NOTE — Assessment & Plan Note (Signed)
New - but possibly with recurrent symptoms - bright rectal bleeding (and less pain than expected) Will manage conservatively with sitz baths and fiber/ stool softener/ avoid straining Given handout and pt voiced understanding  Hesitant to start nitro off the bat due to baseline low bp  Will update if not imp

## 2011-07-21 ENCOUNTER — Ambulatory Visit (INDEPENDENT_AMBULATORY_CARE_PROVIDER_SITE_OTHER): Payer: PRIVATE HEALTH INSURANCE | Admitting: Family Medicine

## 2011-07-21 ENCOUNTER — Encounter: Payer: Self-pay | Admitting: Family Medicine

## 2011-07-21 VITALS — BP 84/60 | HR 80 | Temp 98.1°F | Ht 63.5 in | Wt 117.2 lb

## 2011-07-21 DIAGNOSIS — F988 Other specified behavioral and emotional disorders with onset usually occurring in childhood and adolescence: Secondary | ICD-10-CM

## 2011-07-21 DIAGNOSIS — R634 Abnormal weight loss: Secondary | ICD-10-CM

## 2011-07-21 MED ORDER — AMPHETAMINE-DEXTROAMPHET ER 30 MG PO CP24: 30.0000 mg | ORAL_CAPSULE | ORAL | Status: DC

## 2011-07-21 NOTE — Assessment & Plan Note (Signed)
Doing well on current adderall Wt is stable Will watch this closely- long disc about nutrition with dec appetite Will stop med when ready to conceive also

## 2011-07-21 NOTE — Patient Instructions (Signed)
No change in your ADD medicine  Weight is stable -please track this  Continue a good breakfast with 2-3 snacks (with protien) If weight trends down, let me know

## 2011-07-21 NOTE — Progress Notes (Signed)
Subjective:    Patient ID: [name], female    DOB: 1985-11-07, 26 y.o.   MRN: [id number]  HPI Is feeling good  ADD well control  Wt is overall stable   Doing better with eating  Breakfast - cereal bar with fruit and yogurt  Mid morning snack-- different things - like banana with PB , crackers/ quacamole/ crackers  May need more protien   Lunch -- chicken salad sandwhich or salad (with cheese and grilled check)   Then mid afternoon snack   Has to force herself to eat with no appetite -but is ok about it   Full meal for dinner  No food after   No more problems with anal fissure Nothing new going on  Patient Active Problem List  Diagnoses  . ADD  . ALLERGIC RHINITIS  . OVARIAN CYST  . ACNE VULGARIS  . NECK PAIN  . HEADACHE  . NEPHROLITHIASIS, HX OF  . Weight loss, unintentional  . Anal fissure   Past Medical History  Diagnosis Date  . Allergic rhinitis   . Personal history of urinary calculi   . ADD (attention deficit disorder)   . Endometriosis   . Other acne   . Headache   . Other and unspecified ovarian cyst    Past Surgical History  Procedure Date  . Appendectomy 3/07  . Mole removal     ? atypical  . Dilation and curettage of uterus 4/10    miscarriage  . Laparoscopy 2006    pelvic--endometriosis   History  Substance Use Topics  . Smoking status: Never Smoker   . Smokeless tobacco: Not on file  . Alcohol Use: Not on file   Family History  Problem Relation Age of Onset  . Colon polyps Father   . Other Father     Brain tumor  . Depression Mother    Allergies  Allergen Reactions  . Allegra-D     REACTION: ? reaction  . Pseudoephedrine     REACTION: reaction not known   Current Outpatient Prescriptions on File Prior to Visit  Medication Sig Dispense Refill  . butalbital-acetaminophen-caffeine (FIORICET WITH CODEINE) 50-325-40-30 MG per capsule Take 1-2 capsules by mouth every 4 (four) hours as needed. Max 6 in 24 hours       .  cyclobenzaprine (FLEXERIL) 10 MG tablet Take 5-10 mg by mouth at bedtime.            Review of Systems Review of Systems  Constitutional: Negative for fever, appetite change, fatigue and unexpected weight change.  Eyes: Negative for pain and visual disturbance.  Respiratory: Negative for cough and shortness of breath.   Cardiovascular: Negative for cp or palpitations    Gastrointestinal: Negative for nausea, diarrhea and constipation.  Genitourinary: Negative for urgency and frequency.  Skin: Negative for pallor or rash   Neurological: Negative for weakness, light-headedness, numbness and headaches.  Hematological: Negative for adenopathy. Does not bruise/bleed easily.  Psychiatric/Behavioral: Negative for dysphoric mood. The patient is not nervous/anxious.          Objective:   Physical Exam  Constitutional: She appears well-developed and well-nourished. No distress.       Slim and well appearing   HENT:  Head: Normocephalic and atraumatic.  Mouth/Throat: Oropharynx is clear and moist.  Eyes: Conjunctivae and EOM are normal. Pupils are equal, round, and reactive to light. No scleral icterus.  Neck: Normal range of motion. Neck supple. No JVD present. No thyromegaly present.  Cardiovascular: Normal  rate, regular rhythm and normal heart sounds.   Pulmonary/Chest: Effort normal and breath sounds normal. No respiratory distress. She has no wheezes.  Abdominal: Bowel sounds are normal. She exhibits no distension and no mass. There is no tenderness.  Musculoskeletal: She exhibits no edema.  Lymphadenopathy:    She has no cervical adenopathy.  Neurological: She is alert. She has normal reflexes. No cranial nerve deficit. She exhibits normal muscle tone. Coordination normal.  Skin: Skin is warm and dry.  Psychiatric: She has a normal mood and affect.          Assessment & Plan:

## 2011-07-21 NOTE — Assessment & Plan Note (Signed)
This has leveled off  Pt compliant with bkfast and 2 snacks Asked to add more protien Will continue to follow Feels good

## 2011-09-13 ENCOUNTER — Other Ambulatory Visit: Payer: Self-pay

## 2011-09-13 MED ORDER — AMPHETAMINE-DEXTROAMPHET ER 30 MG PO CP24: 30.0000 mg | ORAL_CAPSULE | ORAL | Status: DC

## 2011-09-13 NOTE — Telephone Encounter (Signed)
Rx ready for pick up, patient advised as instructed via telephone, will be left at front desk.

## 2011-09-13 NOTE — Telephone Encounter (Signed)
Pt request written rx for Adderall XR 30 mg. Pt last seen 07/21/11. Please call pt at [phone] when rx ready for pick up.

## 2011-09-14 MED ORDER — AMPHETAMINE-DEXTROAMPHET ER 30 MG PO CP24: 30.0000 mg | ORAL_CAPSULE | ORAL | Status: DC

## 2011-09-14 NOTE — Telephone Encounter (Signed)
Rx was reprinted, original was put in the mail by accident.

## 2011-09-14 NOTE — Telephone Encounter (Signed)
Addended by: Gilmer Mor on: 09/14/2011 07:57 AM   Modules accepted: Orders

## 2011-11-02 ENCOUNTER — Other Ambulatory Visit: Payer: Self-pay

## 2011-11-02 MED ORDER — AMPHETAMINE-DEXTROAMPHET ER 30 MG PO CP24: 30.0000 mg | ORAL_CAPSULE | ORAL | Status: DC

## 2011-11-02 NOTE — Telephone Encounter (Signed)
Px printed for pick up in IN box  

## 2011-11-02 NOTE — Telephone Encounter (Signed)
Pt request written rx Adderall XR 30 mg. Pt last seen 07/21/11. When rx ready for pick up call [phone].

## 2011-11-02 NOTE — Telephone Encounter (Signed)
Left message on cell phone voicemail advising patient that Rx is ready for pick up will be left at front desk. 

## 2011-12-28 ENCOUNTER — Other Ambulatory Visit: Payer: Self-pay

## 2011-12-28 MED ORDER — AMPHETAMINE-DEXTROAMPHET ER 30 MG PO CP24: 30.0000 mg | ORAL_CAPSULE | ORAL | Status: DC

## 2011-12-28 NOTE — Telephone Encounter (Signed)
Left message on patient vm to that Rx was ready for pickup.

## 2011-12-28 NOTE — Telephone Encounter (Signed)
Px printed for pick up in IN box  

## 2011-12-28 NOTE — Telephone Encounter (Signed)
Pt request rx Adderall.call when ready for pick up.

## 2012-02-15 ENCOUNTER — Other Ambulatory Visit: Payer: Self-pay

## 2012-02-15 MED ORDER — AMPHETAMINE-DEXTROAMPHET ER 30 MG PO CP24: 30.0000 mg | ORAL_CAPSULE | ORAL | Status: DC

## 2012-02-15 NOTE — Telephone Encounter (Signed)
Px printed for pick up in IN box  

## 2012-02-15 NOTE — Telephone Encounter (Signed)
Patient notified.  Rx in the front office ready for pick up.

## 2012-02-15 NOTE — Telephone Encounter (Signed)
Pt request rx Adderall.call when ready for pick up. 

## 2012-03-29 ENCOUNTER — Other Ambulatory Visit: Payer: Self-pay | Admitting: *Deleted

## 2012-03-29 MED ORDER — AMPHETAMINE-DEXTROAMPHET ER 30 MG PO CP24: 30.0000 mg | ORAL_CAPSULE | ORAL | Status: DC

## 2012-03-29 NOTE — Telephone Encounter (Signed)
Patient called requesting a refill on her Adderall. Call when ready for pickup. 

## 2012-03-29 NOTE — Telephone Encounter (Signed)
Px printed for pick up in IN box  

## 2012-03-29 NOTE — Telephone Encounter (Signed)
Notified pt Rx ready for pick up 

## 2012-05-10 ENCOUNTER — Other Ambulatory Visit: Payer: Self-pay

## 2012-05-10 MED ORDER — AMPHETAMINE-DEXTROAMPHET ER 30 MG PO CP24: 30.0000 mg | ORAL_CAPSULE | ORAL | Status: DC

## 2012-05-10 NOTE — Telephone Encounter (Signed)
Left voicemail letting pt know Rx ready for pick up 

## 2012-05-10 NOTE — Telephone Encounter (Signed)
Pt request rx for adderall. Call when ready for pick up. 

## 2012-05-10 NOTE — Telephone Encounter (Signed)
Px printed for pick up in IN box  

## 2012-06-26 ENCOUNTER — Other Ambulatory Visit: Payer: Self-pay | Admitting: Family Medicine

## 2012-06-26 MED ORDER — AMPHETAMINE-DEXTROAMPHET ER 30 MG PO CP24: 30.0000 mg | ORAL_CAPSULE | ORAL | Status: DC

## 2012-06-26 NOTE — Telephone Encounter (Signed)
Pt request RX for adderall. Call when ready for pick up.

## 2012-06-26 NOTE — Telephone Encounter (Signed)
Pt notified Rx ready for pickup 

## 2012-06-26 NOTE — Telephone Encounter (Signed)
Px printed for pick up in IN box  

## 2012-08-21 ENCOUNTER — Other Ambulatory Visit: Payer: Self-pay

## 2012-08-21 MED ORDER — AMPHETAMINE-DEXTROAMPHET ER 30 MG PO CP24: 30.0000 mg | ORAL_CAPSULE | ORAL | Status: DC

## 2012-08-21 NOTE — Telephone Encounter (Signed)
Px printed for pick up in IN box  

## 2012-08-21 NOTE — Telephone Encounter (Signed)
Pt left v/m requesting rx adderall xr. Call when ready for pick up.

## 2012-08-22 NOTE — Telephone Encounter (Signed)
Pt notified Rx ready for pickup 

## 2012-10-16 ENCOUNTER — Other Ambulatory Visit: Payer: Self-pay

## 2012-10-16 MED ORDER — AMPHETAMINE-DEXTROAMPHET ER 30 MG PO CP24: 30.0000 mg | ORAL_CAPSULE | ORAL | Status: DC

## 2012-10-16 NOTE — Telephone Encounter (Signed)
F/u appt scheduled for 10/31/12 and pt notified Rx ready for pick-up

## 2012-10-16 NOTE — Telephone Encounter (Signed)
Pt request rx adderall. Call when ready for pick up. 

## 2012-10-16 NOTE — Telephone Encounter (Signed)
Px printed for pick up in IN box  Please schedule f/u when able - she has not been in in a while

## 2012-10-30 ENCOUNTER — Encounter: Payer: Self-pay | Admitting: Radiology

## 2012-10-31 ENCOUNTER — Ambulatory Visit (INDEPENDENT_AMBULATORY_CARE_PROVIDER_SITE_OTHER): Payer: BC Managed Care – PPO | Admitting: Family Medicine

## 2012-10-31 ENCOUNTER — Encounter: Payer: Self-pay | Admitting: Family Medicine

## 2012-10-31 VITALS — BP 114/82 | HR 77 | Temp 98.2°F | Ht 63.5 in | Wt 120.5 lb

## 2012-10-31 DIAGNOSIS — F988 Other specified behavioral and emotional disorders with onset usually occurring in childhood and adolescence: Secondary | ICD-10-CM

## 2012-10-31 NOTE — Patient Instructions (Addendum)
I'm glad you are doing well and adderall is working  I'm glad you have gained weight and nutritional status is good

## 2012-10-31 NOTE — Assessment & Plan Note (Signed)
Doing very well -no side effects and pt has increased calorie intake to make up for the initial weight loss  Concentration is good Does not take on the weekends  Will continue - but stop if she decides to become pregnant in the future

## 2012-10-31 NOTE — Progress Notes (Signed)
Subjective:    Patient ID: [name], female    DOB: 04/03/1986, 27 y.o.   MRN: [id number]  HPI Here for f/u of ADD  Takes adderall xr 30 mg daily  Still helping No side effects  No stomach ache or headache  Affects appetite and she tries to make up for that evenings and weekends   Wt is up 3 lb with bmi of 21  Doing better with eating   bp is good  Wt is good  No tremor   No new health problems   Last gyn visit was - about 1.5 years ago with pap smear   Not trying to get pregnant right now  Using IUD for contraception - really helps endometriosis  Endometriosis flares now and then   Td 11/05    Patient Active Problem List   Diagnosis Date Noted  . Anal fissure 06/02/2011  . Weight loss, unintentional 04/22/2011  . NECK PAIN 02/18/2010  . HEADACHE 02/18/2010  . ADD 11/24/2006  . ALLERGIC RHINITIS 11/21/2006  . OVARIAN CYST 11/21/2006  . 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 NEPHROLITHIASIS, HX OF 11/21/2006   Past Medical History  Diagnosis Date  . Allergic rhinitis   . Personal history of urinary calculi   . ADD (attention deficit disorder)   . Endometriosis   . Other acne   . Headache   . Other and unspecified ovarian cyst    Past Surgical History  Procedure Laterality Date  . Appendectomy  3/07  . Mole removal      ? atypical  . Dilation and curettage of uterus  4/10    miscarriage  . Laparoscopy  2006    pelvic--endometriosis   History  Substance Use Topics  . Smoking status: Never Smoker   . Smokeless tobacco: Not on file  . Alcohol Use: Yes     Comment: rare   Family History  Problem Relation Age of Onset  . Colon polyps Father   . Other Father     Brain tumor  . Depression Mother    Allergies  Allergen Reactions  . Fexofenadine-Pseudoephed Er     REACTION: ? reaction  . Pseudoephedrine     REACTION: reaction not known   Current Outpatient Prescriptions on File Prior to Visit  Medication Sig Dispense Refill  .  amphetamine-dextroamphetamine (ADDERALL XR) 30 MG 24 hr capsule Take 1 capsule (30 mg total) by mouth every morning.  30 capsule  0   No current facility-administered medications on file prior to visit.     Review of Systems    Review of Systems  Constitutional: Negative for fever, appetite change, fatigue and unexpected weight change.  Eyes: Negative for pain and visual disturbance.  Respiratory: Negative for cough and shortness of breath.   Cardiovascular: Negative for cp or palpitations    Gastrointestinal: Negative for nausea, diarrhea and constipation.  Genitourinary: Negative for urgency and frequency.  Skin: Negative for pallor or rash   Neurological: Negative for weakness, light-headedness, numbness and headaches.  Hematological: Negative for adenopathy. Does not bruise/bleed easily.  Psychiatric/Behavioral: Negative for dysphoric mood. The patient is not nervous/anxious.      Objective:   Physical Exam  Constitutional: She appears well-developed and well-nourished. No distress.  HENT:  Head: Normocephalic and atraumatic.  Mouth/Throat: Oropharynx is clear and moist.  Eyes: Conjunctivae and EOM are normal. Pupils are equal, round, and reactive to light. Right eye exhibits no discharge. Left eye exhibits no discharge. No scleral icterus.  Neck:  Normal range of motion. Neck supple. No JVD present. Carotid bruit is not present. No thyromegaly present.  Cardiovascular: Normal rate, regular rhythm, normal heart sounds and intact distal pulses.  Exam reveals no gallop.   No murmur heard. Pulmonary/Chest: Effort normal and breath sounds normal. No respiratory distress. She has no wheezes.  Abdominal: Soft. Bowel sounds are normal. She exhibits no abdominal bruit.  Lymphadenopathy:    She has no cervical adenopathy.  Neurological: She is alert. She has normal reflexes. She displays no tremor.  Skin: Skin is warm and dry. No rash noted. No pallor.  Psychiatric: She has a normal  mood and affect.          Assessment & Plan:

## 2012-11-27 ENCOUNTER — Encounter: Payer: Self-pay | Admitting: Family Medicine

## 2012-12-01 ENCOUNTER — Other Ambulatory Visit: Payer: Self-pay

## 2012-12-01 NOTE — Telephone Encounter (Signed)
Pt left v/m requesting rx adderall. Call when ready for pick up. 

## 2012-12-02 MED ORDER — AMPHETAMINE-DEXTROAMPHET ER 30 MG PO CP24: 30.0000 mg | ORAL_CAPSULE | ORAL | Status: DC

## 2012-12-02 NOTE — Telephone Encounter (Signed)
Printed.  Please give to her after I sign.  Thanks.

## 2012-12-04 NOTE — Telephone Encounter (Signed)
Patient advised.  Rx left at front desk for pick up. 

## 2013-01-15 ENCOUNTER — Other Ambulatory Visit: Payer: Self-pay

## 2013-01-15 MED ORDER — AMPHETAMINE-DEXTROAMPHET ER 30 MG PO CP24: 30.0000 mg | ORAL_CAPSULE | ORAL | Status: DC

## 2013-01-15 NOTE — Telephone Encounter (Signed)
ptleft v/m requesting rx adderall. Call when ready for pick up.

## 2013-01-15 NOTE — Telephone Encounter (Signed)
Px printed for pick up in IN box  

## 2013-01-15 NOTE — Telephone Encounter (Signed)
Left voicemail letting pt know Rx ready for pick up 

## 2013-01-18 ENCOUNTER — Ambulatory Visit (INDEPENDENT_AMBULATORY_CARE_PROVIDER_SITE_OTHER): Payer: BC Managed Care – PPO | Admitting: Family Medicine

## 2013-01-18 ENCOUNTER — Encounter: Payer: Self-pay | Admitting: Family Medicine

## 2013-01-18 VITALS — BP 90/60 | HR 100 | Temp 98.1°F | Wt 122.0 lb

## 2013-01-18 DIAGNOSIS — R002 Palpitations: Secondary | ICD-10-CM | POA: Insufficient documentation

## 2013-01-18 DIAGNOSIS — R0602 Shortness of breath: Secondary | ICD-10-CM | POA: Insufficient documentation

## 2013-01-18 NOTE — Progress Notes (Signed)
Subjective:    Patient ID: [name], female    DOB: July 22, 1985, 27 y.o.   MRN: [id number]  HPI  27 year old female  Pt of Dr. Milinda Antis with ADD on adderall presents for new onset palpitations and heart racing.  In the last 3 weeks she has had episodes of difficulty getting her breath, lasts few hours to all day. Chest pressure and heart racing and pounding, yesterday noted it skipping a beat or fluttering when she was sitting at her desk yesterday and some earlier in the week.  Also diffuse abdominal bloating yesterday... Better this AM.  No exertional symptoms.  No recent med changes of adderall... She does not take this every day and has not noted an association.  Denies stress, anxiety, depression.  She has never had similar episodes in past. Getting  A lot of fluids, not excessive, 2 cups of tea a day.    Review of Systems  Constitutional: Negative for fever and fatigue.  HENT: Negative for ear pain.   Eyes: Negative for pain.  Respiratory: Negative for chest tightness and shortness of breath.   Cardiovascular: Negative for chest pain, palpitations and leg swelling.  Gastrointestinal: Positive for abdominal distention. Negative for abdominal pain.       No heartburn  Genitourinary: Negative for dysuria, hematuria and vaginal bleeding.       Menses are spotty, normal for her, not heavy.  Neurological: Negative for dizziness, syncope, light-headedness and headaches.  Psychiatric/Behavioral: Negative for agitation. The patient is not nervous/anxious.        Objective:   Physical Exam  Constitutional: Vital signs are normal. She appears well-developed and well-nourished. She is cooperative.  Non-toxic appearance. She does not appear ill. No distress.  HENT:  Head: Normocephalic.  Right Ear: Hearing, tympanic membrane, external ear and ear canal normal. Tympanic membrane is not erythematous, not retracted and not bulging.  Left Ear: Hearing, tympanic membrane, external ear  and ear canal normal. Tympanic membrane is not erythematous, not retracted and not bulging.  Nose: No mucosal edema or rhinorrhea. Right sinus exhibits no maxillary sinus tenderness and no frontal sinus tenderness. Left sinus exhibits no maxillary sinus tenderness and no frontal sinus tenderness.  Mouth/Throat: Uvula is midline, oropharynx is clear and moist and mucous membranes are normal.  Eyes: Conjunctivae, EOM and lids are normal. Pupils are equal, round, and reactive to light. No foreign bodies found.  Neck: Trachea normal and normal range of motion. Neck supple. Carotid bruit is not present. No mass and no thyromegaly present.  Cardiovascular: Normal rate, regular rhythm, S1 normal, S2 normal, normal heart sounds, intact distal pulses and normal pulses.  Exam reveals no gallop and no friction rub.   No murmur heard. Pulmonary/Chest: Effort normal and breath sounds normal. Not tachypneic. No respiratory distress. She has no decreased breath sounds. She has no wheezes. She has no rhonchi. She has no rales.  Abdominal: Soft. Normal appearance and bowel sounds are normal. There is no tenderness.  Neurological: She is alert.  Skin: Skin is warm, dry and intact. No rash noted.  Psychiatric: Her speech is normal and behavior is normal. Judgment and thought content normal. Her mood appears anxious. Cognition and memory are normal. She does not exhibit a depressed mood.          Assessment & Plan:  SOB and palpitations: Exam nml today expect pt appear somewhat anxious. ( SHe denied anxiety, panic attacks) EKG nml. Will eval further with labs to look for  electrolyte issues, anemia, thyroid issues.  Will have her hold stimulants.. Adderall, caffeine, push fluids.  If symptoms continuing, follow up with PCP for further eval and ? holter monitor.

## 2013-01-18 NOTE — Patient Instructions (Addendum)
Hold adderall. Stop caffeine. Increase fluids. Stop at lab on your way out.. We will call with the results. Make appt with Dr. Milinda Antis if your symptoms are not improving in next 2 weeks.

## 2013-01-19 ENCOUNTER — Encounter: Payer: Self-pay | Admitting: Family Medicine

## 2013-01-19 LAB — COMPREHENSIVE METABOLIC PANEL
ALT: 11 U/L (ref 0–35)
Albumin: 4.5 g/dL (ref 3.5–5.2)
BUN: 12 mg/dL (ref 6–23)
CO2: 27 mEq/L (ref 19–32)
Calcium: 9.3 mg/dL (ref 8.4–10.5)
Chloride: 102 mEq/L (ref 96–112)
Creatinine, Ser: 0.8 mg/dL (ref 0.4–1.2)
GFR: 86.08 mL/min (ref >60.00)
Glucose, Bld: 79 mg/dL (ref 70–99)
Potassium: 4 mEq/L (ref 3.5–5.1)
Sodium: 137 mEq/L (ref 135–145)
Total Bilirubin: 0.5 mg/dL (ref 0.3–1.2)
Total Protein: 7.4 g/dL (ref 6.0–8.3)

## 2013-01-19 LAB — CBC WITH DIFFERENTIAL/PLATELET
Basophils Absolute: 0 10*3/uL (ref 0.0–0.1)
Basophils Relative: 0.4 % (ref 0.0–3.0)
Eosinophils Absolute: 0 10*3/uL (ref 0.0–0.7)
Eosinophils Relative: 0.5 % (ref 0.0–5.0)
HCT: 44.2 % (ref 36.0–46.0)
Lymphocytes Relative: 40.3 % (ref 12.0–46.0)
Lymphs Abs: 2.8 10*3/uL (ref 0.7–4.0)
MCHC: 33.2 g/dL (ref 30.0–36.0)
MCV: 95.8 fl (ref 78.0–100.0)
Monocytes Absolute: 0.2 10*3/uL (ref 0.1–1.0)
Monocytes Relative: 3.2 % (ref 3.0–12.0)
Neutro Abs: 3.9 10*3/uL (ref 1.4–7.7)
Neutrophils Relative %: 55.6 % (ref 43.0–77.0)
Platelets: 186 10*3/uL (ref 150.0–400.0)
RBC: 4.61 Mil/uL (ref 3.87–5.11)
RDW: 12.1 % (ref 11.5–14.6)
WBC: 7 10*3/uL (ref 4.5–10.5)

## 2013-01-19 LAB — TSH: TSH: 1.28 u[IU]/mL (ref 0.35–5.50)

## 2013-01-19 NOTE — Telephone Encounter (Signed)
Sent mychart message

## 2013-02-05 ENCOUNTER — Ambulatory Visit: Payer: BC Managed Care – PPO | Admitting: Family Medicine

## 2013-02-06 ENCOUNTER — Encounter: Payer: Self-pay | Admitting: Family Medicine

## 2013-02-06 ENCOUNTER — Telehealth: Payer: Self-pay | Admitting: Family Medicine

## 2013-02-06 ENCOUNTER — Ambulatory Visit (INDEPENDENT_AMBULATORY_CARE_PROVIDER_SITE_OTHER): Payer: BC Managed Care – PPO | Admitting: Family Medicine

## 2013-02-06 VITALS — BP 110/70 | HR 68 | Temp 98.5°F | Ht 63.5 in | Wt 127.2 lb

## 2013-02-06 DIAGNOSIS — R002 Palpitations: Secondary | ICD-10-CM

## 2013-02-06 NOTE — Progress Notes (Signed)
Subjective:    Patient ID: [name], female    DOB: 1986/04/06, 27 y.o.   MRN: [id number]  HPI Here for f/u of palpitations   Happened last week twice   She stopped her adderall for 2 weeks -and no change in palpitations No caffeine Episode recently on vacation- lasted 6-8 hours - went to sleep with it  2nd episode - lasted 1 hour  No particular trigger and no alcohol   Feels pretty good today  Gets sob with episodes only with pressure feeling (not pain )  No PND or orthopnea     Chemistry      Component Value Date/Time   NA 137 01/18/2013 1624   K 4.0 01/18/2013 1624   CL 102 01/18/2013 1624   CO2 27 01/18/2013 1624   BUN 12 01/18/2013 1624   CREATININE 0.8 01/18/2013 1624      Component Value Date/Time   CALCIUM 9.3 01/18/2013 1624   ALKPHOS 45 01/18/2013 1624   AST 18 01/18/2013 1624   ALT 11 01/18/2013 1624   BILITOT 0.5 01/18/2013 1624      Lab Results  Component Value Date   WBC 7.0 01/18/2013   HGB 14.6 01/18/2013   HCT 44.2 01/18/2013   MCV 95.8 01/18/2013   PLT 186.0 01/18/2013    Lab Results  Component Value Date   TSH 1.28 01/18/2013     EKG was normal  No anx / stress / panic  Nl vitals   GM- had heart issues - CHF / MI and stroke  No other family hx   Patient Active Problem List   Diagnosis Date Noted  . Palpitations 01/18/2013  . Shortness of breath 01/18/2013  . Anal fissure 06/02/2011  . Weight loss, unintentional 04/22/2011  . NECK PAIN 02/18/2010  . HEADACHE 02/18/2010  . ADD 11/24/2006  . ALLERGIC RHINITIS 11/21/2006  . OVARIAN CYST 11/21/2006  . 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 NEPHROLITHIASIS, HX OF 11/21/2006   Past Medical History  Diagnosis Date  . Allergic rhinitis   . Personal history of urinary calculi   . ADD (attention deficit disorder)   . Endometriosis   . Other acne   . Headache(784.0)   . Other and unspecified ovarian cyst    Past Surgical History  Procedure Laterality Date  . Appendectomy  3/07  . Mole removal      ? atypical  .  Dilation and curettage of uterus  4/10    miscarriage  . Laparoscopy  2006    pelvic--endometriosis   History  Substance Use Topics  . Smoking status: Never Smoker   . Smokeless tobacco: Not on file  . Alcohol Use: Yes     Comment: rare   Family History  Problem Relation Age of Onset  . Colon polyps Father   . Other Father     Brain tumor  . Depression Mother    Allergies  Allergen Reactions  . Fexofenadine-Pseudoephed Er     REACTION: ? reaction  . Pseudoephedrine     REACTION: reaction not known   Current Outpatient Prescriptions on File Prior to Visit  Medication Sig Dispense Refill  . amphetamine-dextroamphetamine (ADDERALL XR) 30 MG 24 hr capsule Take 1 capsule (30 mg total) by mouth every morning.  30 capsule  0   No current facility-administered medications on file prior to visit.    Review of Systems Review of Systems  Constitutional: Negative for fever, appetite change, fatigue and unexpected weight change.  Eyes: Negative  for pain and visual disturbance.  Respiratory: Negative for cough and wheezing  Cardiovascular: Negative for cp or edema/PND/ orthopnea , pos for palpitations   Gastrointestinal: Negative for nausea, diarrhea and constipation. neg for acid reflux symptoms Genitourinary: Negative for urgency and frequency.  Skin: Negative for pallor or rash   Neurological: Negative for weakness, light-headedness, numbness and headaches.  Hematological: Negative for adenopathy. Does not bruise/bleed easily.  Psychiatric/Behavioral: Negative for dysphoric mood. The patient is not nervous/anxious.         Objective:   Physical Exam  Constitutional: She appears well-developed and well-nourished. No distress.  HENT:  Head: Normocephalic and atraumatic.  Mouth/Throat: Oropharynx is clear and moist.  Eyes: Conjunctivae and EOM are normal. Pupils are equal, round, and reactive to light. No scleral icterus.  Neck: Normal range of motion. Neck supple. No JVD  present. Carotid bruit is not present. No thyromegaly present.  Cardiovascular: Normal rate, regular rhythm, normal heart sounds and intact distal pulses.  Exam reveals no gallop.   No murmur heard. Pulmonary/Chest: Effort normal and breath sounds normal. No respiratory distress. She has no wheezes.  No crackles  Abdominal: Soft. Bowel sounds are normal.  Musculoskeletal: She exhibits no edema.  Lymphadenopathy:    She has no cervical adenopathy.  Neurological: She is alert. She has normal reflexes. She displays no tremor. She exhibits normal muscle tone. Coordination normal.  Skin: Skin is warm and dry. No rash noted. No pallor.  Psychiatric: She has a normal mood and affect.          Assessment & Plan:

## 2013-02-06 NOTE — Patient Instructions (Addendum)
Stop up front for your cardiology referral  Continue to avoid caffeine  If you have a prolonged or severe epidose of palpitations- let us know and go to the ER

## 2013-02-06 NOTE — Telephone Encounter (Signed)
The patient is hoping to get a referral to cardiology.  The number in the system is her preferred contact info. Thanks!

## 2013-02-06 NOTE — Assessment & Plan Note (Signed)
Nl labs and EKG No imp off caffeine or adderall  Pt denies anx or stressors  Ref to cardiology for further eval  Will go to ER if symptoms acutely worsen-see AVS

## 2013-02-07 ENCOUNTER — Telehealth (HOSPITAL_COMMUNITY): Payer: Self-pay | Admitting: *Deleted

## 2013-02-07 DIAGNOSIS — R002 Palpitations: Secondary | ICD-10-CM

## 2013-02-07 NOTE — Telephone Encounter (Signed)
Per Dr Gala Romney pt being referred for palps he would like pt to have monitor before he sees her, pt reports symptoms are occuring a couple of times a week Dr Gala Romney feels a 24 or 48 hour monitor may not be long enough to catch pt's symptoms so he is ordering a 2 week event monitor, order placed [facility] will contact pt to schedule

## 2013-02-08 ENCOUNTER — Encounter: Payer: Self-pay | Admitting: *Deleted

## 2013-02-08 ENCOUNTER — Encounter (INDEPENDENT_AMBULATORY_CARE_PROVIDER_SITE_OTHER): Payer: BC Managed Care – PPO

## 2013-02-08 DIAGNOSIS — R002 Palpitations: Secondary | ICD-10-CM

## 2013-02-08 NOTE — Progress Notes (Signed)
Patient ID: [name], female   DOB: Mar 08, 1986, 27 y.o.   MRN: [id number] E-Cardio verite 14 day cardiac event monitor applied to patient.

## 2013-02-09 ENCOUNTER — Ambulatory Visit (HOSPITAL_COMMUNITY): Payer: BC Managed Care – PPO

## 2013-02-19 ENCOUNTER — Telehealth (HOSPITAL_COMMUNITY): Payer: Self-pay | Admitting: *Deleted

## 2013-02-19 DIAGNOSIS — R002 Palpitations: Secondary | ICD-10-CM

## 2013-02-19 MED ORDER — METOPROLOL TARTRATE 25 MG PO TABS: 12.5000 mg | ORAL_TABLET | Freq: Two times a day (BID) | ORAL | Status: DC

## 2013-02-19 NOTE — Telephone Encounter (Signed)
Pt aware and agreeable, echo sch for thur

## 2013-02-19 NOTE — Telephone Encounter (Signed)
Received 2 monitor reports from the weekend, pt with ST at a rate in the 150s pt aware and states she was [place] the [place] field but really feel anything although she did states Sat afternoon she had a horrible headache and was very tired.  Per Dr Gala Romney order an echocardiogram and start pt on metoprolol 12.5 mg bid

## 2013-02-19 NOTE — Telephone Encounter (Signed)
Order placed for upcoming ECHO 

## 2013-02-22 ENCOUNTER — Ambulatory Visit (HOSPITAL_COMMUNITY)
Admission: RE | Admit: 2013-02-22 | Discharge: 2013-02-22 | Disposition: A | Discharge status: Home / Self Care | Payer: BC Managed Care – PPO | Source: Ambulatory Visit | Attending: Family Medicine | Admitting: Family Medicine

## 2013-02-22 DIAGNOSIS — R002 Palpitations: Secondary | ICD-10-CM

## 2013-02-22 DIAGNOSIS — R0602 Shortness of breath: Secondary | ICD-10-CM

## 2013-02-22 NOTE — Progress Notes (Signed)
Echocardiogram 2D Echocardiogram has been performed.  [name] 02/22/2013, 11:43 AM

## 2013-03-05 ENCOUNTER — Telehealth (HOSPITAL_COMMUNITY): Payer: Self-pay | Admitting: *Deleted

## 2013-03-05 NOTE — Telephone Encounter (Signed)
Called pt with monitor results, SR, tachycardia likely sinus tach continue metoprolol per Dr Gala Romney, pt aware and agreeable, she feels med maybe helping symptoms

## 2013-03-12 ENCOUNTER — Other Ambulatory Visit: Payer: Self-pay

## 2013-03-12 MED ORDER — AMPHETAMINE-DEXTROAMPHET ER 30 MG PO CP24: 30.0000 mg | ORAL_CAPSULE | ORAL | Status: DC

## 2013-03-12 NOTE — Telephone Encounter (Signed)
Px printed for pick up in IN box  

## 2013-03-12 NOTE — Telephone Encounter (Signed)
Pt left v/m requesting rx adderall. Call when ready for pick up. 

## 2013-03-13 NOTE — Telephone Encounter (Signed)
Pt notified Rx ready for pickup 

## 2013-03-22 ENCOUNTER — Other Ambulatory Visit (HOSPITAL_COMMUNITY): Payer: Self-pay | Admitting: *Deleted

## 2013-03-22 MED ORDER — METOPROLOL TARTRATE 25 MG PO TABS: 12.5000 mg | ORAL_TABLET | Freq: Two times a day (BID) | ORAL | Status: DC

## 2013-04-19 ENCOUNTER — Other Ambulatory Visit: Payer: Self-pay

## 2013-04-26 ENCOUNTER — Other Ambulatory Visit: Payer: Self-pay

## 2013-04-26 MED ORDER — AMPHETAMINE-DEXTROAMPHET ER 30 MG PO CP24: 30.0000 mg | ORAL_CAPSULE | ORAL | Status: DC

## 2013-04-26 NOTE — Telephone Encounter (Signed)
Printed and placed in Kim's box. 

## 2013-04-26 NOTE — Telephone Encounter (Signed)
Pt left v/m requesting rx adderall. Call when ready for pick up. Pt will be out of med before Dr Milinda Antis' return next week.

## 2013-04-27 NOTE — Telephone Encounter (Signed)
Patient notified and Rx placed up front for pick up. Advised to bring ID to pick up Rx.

## 2013-06-18 ENCOUNTER — Other Ambulatory Visit: Payer: Self-pay

## 2013-06-18 MED ORDER — AMPHETAMINE-DEXTROAMPHET ER 30 MG PO CP24: 30.0000 mg | ORAL_CAPSULE | ORAL | Status: DC

## 2013-06-18 NOTE — Telephone Encounter (Signed)
Pt left v/m requesting rx adderall. Call when ready for pick up. 

## 2013-06-18 NOTE — Telephone Encounter (Signed)
Px printed for pick up in IN box  

## 2013-06-19 NOTE — Telephone Encounter (Signed)
Pt notified Rx ready for pickup 

## 2014-02-04 ENCOUNTER — Observation Stay: Payer: Self-pay

## 2014-03-29 ENCOUNTER — Inpatient Hospital Stay: Payer: Self-pay

## 2014-03-29 LAB — CBC WITH DIFFERENTIAL/PLATELET
BASOS ABS: 0 10*3/uL (ref 0.0–0.1)
BASOS PCT: 0.5 %
Eosinophil #: 0 10*3/uL (ref 0.0–0.7)
Eosinophil %: 0.4 %
HCT: 34.2 % — ABNORMAL LOW (ref 35.0–47.0)
HGB: 11.7 g/dL — ABNORMAL LOW (ref 12.0–16.0)
LYMPHS ABS: 1.9 10*3/uL (ref 1.0–3.6)
LYMPHS PCT: 19.1 %
MCH: 31 pg (ref 26.0–34.0)
MCHC: 34.2 g/dL (ref 32.0–36.0)
MCV: 90 fL (ref 80–100)
Monocyte #: 0.4 x10 3/mm (ref 0.2–0.9)
Monocyte %: 3.8 %
NEUTROS ABS: 7.5 10*3/uL — AB (ref 1.4–6.5)
NEUTROS PCT: 76.2 %
Platelet: 149 10*3/uL — ABNORMAL LOW (ref 150–440)
RBC: 3.79 10*6/uL — ABNORMAL LOW (ref 3.80–5.20)
RDW: 14.1 % (ref 11.5–14.5)
WBC: 9.9 10*3/uL (ref 3.6–11.0)

## 2014-03-30 LAB — HEMATOCRIT: HCT: 31.9 % — ABNORMAL LOW (ref 35.0–47.0)

## 2014-06-15 LAB — HM PAP SMEAR: HM Pap smear: NEGATIVE

## 2014-10-21 ENCOUNTER — Encounter: Payer: Self-pay | Admitting: Family Medicine

## 2014-10-21 ENCOUNTER — Ambulatory Visit (INDEPENDENT_AMBULATORY_CARE_PROVIDER_SITE_OTHER): Payer: BLUE CROSS/BLUE SHIELD | Admitting: Family Medicine

## 2014-10-21 VITALS — BP 116/62 | HR 59 | Temp 98.2°F | Ht 63.5 in | Wt 152.0 lb

## 2014-10-21 DIAGNOSIS — F988 Other specified behavioral and emotional disorders with onset usually occurring in childhood and adolescence: Secondary | ICD-10-CM

## 2014-10-21 DIAGNOSIS — R002 Palpitations: Secondary | ICD-10-CM

## 2014-10-21 DIAGNOSIS — F909 Attention-deficit hyperactivity disorder, unspecified type: Secondary | ICD-10-CM | POA: Diagnosis not present

## 2014-10-21 MED ORDER — AMPHETAMINE-DEXTROAMPHET ER 30 MG PO CP24: 30.0000 mg | ORAL_CAPSULE | ORAL | Status: DC

## 2014-10-21 NOTE — Progress Notes (Signed)
Subjective:    Patient ID: [name], female    DOB: 12/09/1985, 29 y.o.   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 Palpitations 01/18/2013  . Shortness of breath 01/18/2013  . Anal fissure 06/02/2011  . Weight loss, unintentional 04/22/2011  . NECK PAIN 02/18/2010  . HEADACHE 02/18/2010  . Attention deficit disorder 11/24/2006  . ALLERGIC RHINITIS 11/21/2006  . OVARIAN CYST 11/21/2006  . 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 NEPHROLITHIASIS, HX OF 11/21/2006   Past Medical History  Diagnosis Date  . Allergic rhinitis   . Personal history of urinary calculi   . ADD (attention deficit disorder)   . Endometriosis   . Other acne   . Headache(784.0)   . Other and unspecified ovarian cyst    Past Surgical History  Procedure Laterality Date  . Appendectomy  3/07  . Mole removal      ? atypical  . Dilation and curettage of uterus  4/10    miscarriage  . Laparoscopy  2006    pelvic--endometriosis   History  Substance Use Topics  . Smoking status: Never Smoker   . Smokeless tobacco: Not on file  . Alcohol Use: 0.0 oz/week    0 Standard drinks or equivalent per week     Comment: rare   Family History  Problem Relation Age of Onset  . Colon polyps Father   .  Other Father     Brain tumor  . Depression Mother    Allergies  Allergen Reactions  . Fexofenadine-Pseudoephed Er     REACTION: ? reaction  . Pseudoephedrine     REACTION: reaction not known   Current Outpatient Prescriptions on File Prior to Visit  Medication Sig Dispense Refill  . metoprolol tartrate (LOPRESSOR) 25 MG tablet Take 0.5 tablets (12.5 mg total) by mouth 2 (two) times daily. 30 tablet 6   No current facility-administered medications on file prior to visit.      Review of Systems Review of Systems  Constitutional: Negative for fever, appetite change, fatigue and unexpected weight change.  Eyes: Negative for pain and visual disturbance.  Respiratory: Negative for cough and shortness of breath.   Cardiovascular: Negative for cp or palpitations    Gastrointestinal: Negative for nausea, diarrhea and constipation.  Genitourinary: Negative for urgency and frequency.  Skin: Negative for pallor or rash   Neurological: Negative for weakness, light-headedness, numbness and headaches.  Hematological: Negative for adenopathy. Does not bruise/bleed easily.  Psychiatric/Behavioral: Negative for dysphoric mood. The patient is not nervous/anxious.  pos for difficulty concentrating and finishing tasks        Objective:   Physical Exam  Constitutional: She appears well-developed and well-nourished. No distress.  overwt and well app  HENT:  Head: Normocephalic and atraumatic.  Mouth/Throat: Oropharynx is clear and moist.  Eyes: Conjunctivae and EOM are normal. Pupils are equal, round, and reactive to light.  Neck: Normal range of motion. Neck supple. No JVD present. Carotid bruit is not present. No thyromegaly present.  Cardiovascular: Normal rate, regular rhythm, normal heart sounds and intact distal pulses.  Exam reveals no gallop.   Pulmonary/Chest: Effort normal and breath sounds normal. No respiratory distress. She has no wheezes. She has no rales.  No crackles    Abdominal: Soft. Bowel sounds are normal.  Musculoskeletal: She exhibits no edema.  Lymphadenopathy:    She has no cervical adenopathy.  Neurological: She is alert. She has normal reflexes.  No tremor   Skin: Skin is warm and dry. No rash noted.  Psychiatric: She has a normal mood and affect.          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

## 2014-10-21 NOTE — Patient Instructions (Signed)
Start back on adderall xr 30 mg  Update me if any problems with appetite or palpitations or any other symptoms  Update me if not helpful

## 2014-10-21 NOTE — Progress Notes (Signed)
Pre visit review using our clinic review tool, if applicable. No additional management support is needed unless otherwise documented below in the visit note. 

## 2014-10-21 NOTE — Assessment & Plan Note (Signed)
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

## 2014-10-22 NOTE — H&P (Signed)
L&D Evaluation:  History:  HPI 28yoMWF presents at 40 weeks for IOL, normal PNC to date. G2P1001.   Presents with contractions   Patient's Medical History No Chronic Illness   Patient's Surgical History none   Medications Pre Natal Vitamins  Tylenol (Acetaminophen)   Allergies other, Sudafed   Social History none   Family History Non-Contributory   ROS:  ROS All systems were reviewed.  HEENT, CNS, GI, GU, Respiratory, CV, Renal and Musculoskeletal systems were found to be normal.   Exam:  Vital Signs stable   General no apparent distress   Mental Status clear   Chest clear   Heart normal sinus rhythm   Abdomen gravid, non-tender   Estimated Fetal Weight Average for gestational age   Fetal Position vtx   Edema no edema   Pelvic 2-3/50/-1   Mebranes Ruptured, AROM @ 1145a   Description clear   FHT normal rate with no decels   Ucx regular, pitocin @ 859mu/min   Ucx Frequency 3 min   Length of each Contraction 50 seconds   Ucx Pain Scale 2   Impression:  Impression IOL   Plan:  Plan monitor contractions and for cervical change, may have epidural when desires   Electronic Signatures: Yolanda BonineBurr, Melody N (CNM)  (Signed 16-Oct-15 11:57)  Authored: L&D Evaluation   Last Updated: 16-Oct-15 11:57 by Ulyses AmorBurr, Melody N (CNM)

## 2014-12-09 ENCOUNTER — Other Ambulatory Visit: Payer: Self-pay

## 2014-12-09 NOTE — Telephone Encounter (Signed)
Pt left v/m requesting rx for Adderall. Call when ready for pick up. Pt last seen 10/21/14 and rx last printed # 30 on 10/21/14.Please advise.

## 2014-12-10 ENCOUNTER — Encounter: Payer: Self-pay | Admitting: Family Medicine

## 2014-12-10 MED ORDER — AMPHETAMINE-DEXTROAMPHET ER 30 MG PO CP24: 30.0000 mg | ORAL_CAPSULE | ORAL | Status: DC

## 2014-12-10 NOTE — Telephone Encounter (Signed)
Px printed for pick up in IN box  

## 2014-12-10 NOTE — Telephone Encounter (Signed)
Left voicemail letting pt know Rx ready for pick up 

## 2015-01-03 ENCOUNTER — Encounter: Payer: Self-pay | Admitting: Family Medicine

## 2015-01-31 ENCOUNTER — Other Ambulatory Visit: Payer: Self-pay

## 2015-01-31 MED ORDER — AMPHETAMINE-DEXTROAMPHET ER 30 MG PO CP24: 30.0000 mg | ORAL_CAPSULE | ORAL | Status: DC

## 2015-01-31 NOTE — Telephone Encounter (Signed)
Pt left v/m requesting rx for Adderall. Call when ready for pick up.rx last printed # 30 on 12/10/14.pt seen 10/21/2014.

## 2015-01-31 NOTE — Telephone Encounter (Signed)
Pt notified Rx ready for pickup 

## 2015-01-31 NOTE — Telephone Encounter (Signed)
Px printed for pick up in IN box  

## 2015-03-17 ENCOUNTER — Other Ambulatory Visit: Payer: Self-pay

## 2015-03-17 MED ORDER — AMPHETAMINE-DEXTROAMPHET ER 30 MG PO CP24: 30.0000 mg | ORAL_CAPSULE | ORAL | Status: DC

## 2015-03-17 NOTE — Telephone Encounter (Signed)
Pt notified Rx ready for pickup 

## 2015-03-17 NOTE — Telephone Encounter (Signed)
Pt left v/m requesting rx for Adderall. Call when ready for pick up. rx last printed # 30 on 01/31/15. Pt last seen 10/21/14.

## 2015-03-17 NOTE — Telephone Encounter (Signed)
Px printed for pick up in IN box  

## 2015-04-30 ENCOUNTER — Other Ambulatory Visit: Payer: Self-pay | Admitting: Family Medicine

## 2015-05-01 NOTE — Telephone Encounter (Signed)
Will do tomorrow when I return

## 2015-05-02 MED ORDER — AMPHETAMINE-DEXTROAMPHET ER 30 MG PO CP24: 30.0000 mg | ORAL_CAPSULE | ORAL | Status: DC

## 2015-05-02 NOTE — Telephone Encounter (Signed)
Lm on pts vm and informed her Rx is available for pickup from the front desk 

## 2015-05-02 NOTE — Telephone Encounter (Signed)
Px printed for pick up in IN box  

## 2015-06-25 ENCOUNTER — Other Ambulatory Visit: Payer: Self-pay | Admitting: Family Medicine

## 2015-06-25 MED ORDER — AMPHETAMINE-DEXTROAMPHET ER 30 MG PO CP24: 30.0000 mg | ORAL_CAPSULE | ORAL | Status: DC

## 2015-06-25 NOTE — Telephone Encounter (Signed)
See Mychart message. Last OV was 10/21/14, last refilled on 05/02/15 #30 with 0 refills, please advise

## 2015-06-25 NOTE — Telephone Encounter (Signed)
Px printed for pick up in IN box  

## 2015-06-26 NOTE — Telephone Encounter (Signed)
Called pt and no answer and pt's voicemail box was full 

## 2015-06-27 NOTE — Telephone Encounter (Signed)
Attempted to contact pt; unable to leave vm. Rx placed at the front desk, in the event pt inquires about missed call

## 2015-06-27 NOTE — Telephone Encounter (Signed)
mychart message sent advising Rx is available to pickup

## 2015-08-20 ENCOUNTER — Other Ambulatory Visit: Payer: Self-pay | Admitting: Family Medicine

## 2015-08-20 MED ORDER — AMPHETAMINE-DEXTROAMPHET ER 30 MG PO CP24: 30.0000 mg | ORAL_CAPSULE | ORAL | Status: DC

## 2015-08-20 NOTE — Telephone Encounter (Signed)
Last office visit 10/21/2014.  Last refilled 06/25/2015 for #30 with no refills.  Ok to refill?

## 2015-08-20 NOTE — Telephone Encounter (Signed)
Px printed for pick up in IN box  

## 2015-08-20 NOTE — Telephone Encounter (Signed)
Left voicemail letting pt know Rx ready for pick up 

## 2015-09-29 ENCOUNTER — Other Ambulatory Visit: Payer: Self-pay | Admitting: Family Medicine

## 2015-09-29 NOTE — Telephone Encounter (Signed)
Last filled #30 08/20/15, last f/u 10/21/14.

## 2015-09-30 MED ORDER — AMPHETAMINE-DEXTROAMPHET ER 30 MG PO CP24: 30.0000 mg | ORAL_CAPSULE | ORAL | Status: DC

## 2015-09-30 NOTE — Telephone Encounter (Signed)
Pt notified Rx ready for pickup 

## 2015-09-30 NOTE — Telephone Encounter (Signed)
Px printed for pick up in IN box  

## 2015-11-13 ENCOUNTER — Other Ambulatory Visit: Payer: Self-pay | Admitting: Family Medicine

## 2015-11-13 NOTE — Telephone Encounter (Signed)
Last office visit 10/21/2014.  Last refilled 09/30/2015 for #30 with no refills.  Refill?

## 2015-11-14 MED ORDER — AMPHETAMINE-DEXTROAMPHET ER 30 MG PO CP24: 30.0000 mg | ORAL_CAPSULE | ORAL | Status: DC

## 2015-11-14 NOTE — Telephone Encounter (Signed)
Px printed for pick up in IN box  

## 2015-11-14 NOTE — Telephone Encounter (Signed)
Please schedule f/u this summer 30 min office visit

## 2015-11-14 NOTE — Telephone Encounter (Signed)
Pt notified Rx ready for pickup 

## 2015-11-14 NOTE — Addendum Note (Signed)
Addended by: Roxy MannsWER, MARNE A on: 11/14/2015 08:00 AM   Modules accepted: Orders

## 2015-11-26 ENCOUNTER — Ambulatory Visit: Payer: Self-pay | Admitting: Family Medicine

## 2016-01-02 ENCOUNTER — Other Ambulatory Visit: Payer: Self-pay | Admitting: Family Medicine

## 2016-01-02 MED ORDER — AMPHETAMINE-DEXTROAMPHET ER 30 MG PO CP24: 30.0000 mg | ORAL_CAPSULE | ORAL | Status: DC

## 2016-01-02 NOTE — Telephone Encounter (Signed)
Px printed for pick up in IN box  

## 2016-01-02 NOTE — Telephone Encounter (Signed)
Pt notified Rx ready for pickup 

## 2016-01-02 NOTE — Telephone Encounter (Signed)
Last filled on 11/14/15 #30 with 0 refills, pt has a CPE scheduled on 01/26/16, please advise

## 2016-01-04 ENCOUNTER — Emergency Department
Admission: EM | Admit: 2016-01-04 | Discharge: 2016-01-04 | Disposition: A | Discharge status: Home / Self Care | Payer: BLUE CROSS/BLUE SHIELD | Attending: Emergency Medicine | Admitting: Emergency Medicine

## 2016-01-04 ENCOUNTER — Encounter: Payer: Self-pay | Admitting: Emergency Medicine

## 2016-01-04 DIAGNOSIS — L03115 Cellulitis of right lower limb: Secondary | ICD-10-CM | POA: Insufficient documentation

## 2016-01-04 DIAGNOSIS — T63301A Toxic effect of unspecified spider venom, accidental (unintentional), initial encounter: Secondary | ICD-10-CM | POA: Diagnosis not present

## 2016-01-04 DIAGNOSIS — R21 Rash and other nonspecific skin eruption: Secondary | ICD-10-CM | POA: Diagnosis present

## 2016-01-04 LAB — CBC WITH DIFFERENTIAL/PLATELET
Basophils Absolute: 0.1 10*3/uL (ref 0–0.1)
Basophils Relative: 1 %
EOS PCT: 2 %
Eosinophils Absolute: 0.1 10*3/uL (ref 0–0.7)
HCT: 41.5 % (ref 35.0–47.0)
Hemoglobin: 14.8 g/dL (ref 12.0–16.0)
LYMPHS ABS: 2.5 10*3/uL (ref 1.0–3.6)
LYMPHS PCT: 31 %
MCH: 32.3 pg (ref 26.0–34.0)
MCHC: 35.6 g/dL (ref 32.0–36.0)
MCV: 90.8 fL (ref 80.0–100.0)
MONO ABS: 0.4 10*3/uL (ref 0.2–0.9)
MONOS PCT: 5 %
Neutro Abs: 4.9 10*3/uL (ref 1.4–6.5)
Neutrophils Relative %: 61 %
PLATELETS: 167 10*3/uL (ref 150–440)
RBC: 4.57 MIL/uL (ref 3.80–5.20)
RDW: 13.4 % (ref 11.5–14.5)
WBC: 7.9 10*3/uL (ref 3.6–11.0)

## 2016-01-04 LAB — BASIC METABOLIC PANEL
Anion gap: 7 (ref 5–15)
BUN: 12 mg/dL (ref 6–20)
CALCIUM: 9.2 mg/dL (ref 8.9–10.3)
CO2: 26 mmol/L (ref 22–32)
Chloride: 106 mmol/L (ref 101–111)
Creatinine, Ser: 1.09 mg/dL — ABNORMAL HIGH (ref 0.44–1.00)
GFR calc Af Amer: >60 mL/min (ref >60)
GLUCOSE: 74 mg/dL (ref 65–99)
Potassium: 4.3 mmol/L (ref 3.5–5.1)
Sodium: 139 mmol/L (ref 135–145)

## 2016-01-04 MED ORDER — SODIUM CHLORIDE 0.9 % IV BOLUS (SEPSIS)
1000.0000 mL | Freq: Once | INTRAVENOUS | Status: AC
  Administered 2016-01-04: 1000 mL via INTRAVENOUS

## 2016-01-04 MED ORDER — CLINDAMYCIN PHOSPHATE 600 MG/50ML IV SOLN
600.0000 mg | Freq: Once | INTRAVENOUS | Status: AC
  Administered 2016-01-04: 600 mg via INTRAVENOUS
  Filled 2016-01-04: qty 50

## 2016-01-04 MED ORDER — CEPHALEXIN 500 MG PO CAPS: 500.0000 mg | ORAL_CAPSULE | Freq: Four times a day (QID) | ORAL | 0 refills | Status: AC

## 2016-01-04 NOTE — ED Triage Notes (Signed)
Pt states she was bit by a spider on Thursday night and was seen at [facility] and RX'd Bactrim and has been taking but states area is worsening to medial right thigh

## 2016-01-04 NOTE — ED Provider Notes (Signed)
[facility] Emergency Department Provider Note  ____________________________________________  Time seen: Approximately 1:53 PM  I have reviewed the triage vital signs and the nursing notes.   HISTORY  Chief Complaint Insect Bite and Cellulitis    HPI [name] is a 30 y.o. female , NAD, presents to the emergency department with 3 day history of spider bite to the right thigh. Patient states she was seen at outpatient urgent care and placed on Bactrim. Was told if not improving over the next 48 hours or getting worse she should come to the emergency department. Patient states she has noted no improvement in the area of cellulitis and has noted some mild widening of the area. Continues to have intermittent fevers and nausea. Has had no abdominal pain, vomiting, diarrhea. Has not noted any streaking up or down the leg from the lesion. No active oozing or weeping. Has been taking her medications as previously prescribed without missing doses. Denies any numbness, weakness, tingling. Has not had any chills or body aches. Patient notes she is going out of town on vacation in 2 days and is concerned about worsening infection while out of town.   Past Medical History:  Diagnosis Date  . ADD (attention deficit disorder)   . Allergic rhinitis   . Endometriosis   . Headache(784.0)   . Other acne   . Other and unspecified ovarian cyst   . Personal history of urinary calculi     Patient Active Problem List   Diagnosis Date Noted  . Palpitations 01/18/2013  . Anal fissure 06/02/2011  . Attention deficit disorder 11/24/2006  . ALLERGIC RHINITIS 11/21/2006  . OVARIAN CYST 11/21/2006  . 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 NEPHROLITHIASIS, HX OF 11/21/2006    Past Surgical History:  Procedure Laterality Date  . APPENDECTOMY  3/07  . DILATION AND CURETTAGE OF UTERUS  4/10   miscarriage  . LAPAROSCOPY  2006   pelvic--endometriosis  . MOLE REMOVAL     ? atypical     Current Outpatient Rx  . Order #: [id number] Class: Print  . Order #: [id number] Class: Print  . Order #: [id number] Class: Normal    Allergies Fexofenadine-pseudoephed er and Pseudoephedrine  Family History  Problem Relation Age of Onset  . Colon polyps Father   . Other Father     Brain tumor  . Depression Mother     Social History Social History  Substance Use Topics  . Smoking status: Never Smoker  . Smokeless tobacco: Never Used  . Alcohol use 0.0 oz/week     Comment: rare     Review of Systems  Constitutional: Positive intermittent fevers with MAXIMUM TEMPERATURE of 101F. No chills, rigors, decreased appetite, fatigue Cardiovascular: No chest pain. Respiratory: No shortness of breath. No wheezing.  Gastrointestinal: No abdominal pain.  No nausea, vomiting.  No diarrhea.  Musculoskeletal: Negative for back painNor general myalgias.  Skin: Positive for rash. No open wounds, lacerations, skin sores or active oozing or weeping. Neurological: Negative for headaches, focal weakness or numbness. No tingling. 10-point ROS otherwise negative.  ____________________________________________   PHYSICAL EXAM:  VITAL SIGNS: ED Triage Vitals  Enc Vitals Group     BP 01/04/16 1339 (!) 96/55     Pulse Rate 01/04/16 1339 73     Resp 01/04/16 1339 18     Temp 01/04/16 1339 98.4 F (36.9 C)     Temp Source 01/04/16 1339 Oral     SpO2 01/04/16 1339 98 %  Weight 01/04/16 1336 130 lb (59 kg)     Height 01/04/16 1336  (1.6 m)     Head Circumference --      Peak Flow --      Pain Score 01/04/16 1334 3     Pain Loc --      Pain Edu? --      Excl. in GC? --      Constitutional: Alert and oriented. Well appearing and in no acute distress. Eyes: Conjunctivae are normal without icterus or injection.  Head: Atraumatic. Neck: Supple with full range of motion Hematological/Lymphatic/Immunilogical: No cervical lymphadenopathy. Cardiovascular: Normal rate, regular rhythm.  Normal S1 and S2.  Good peripheral circulation with 2+ pulses noted in the right lower extremity. Respiratory: Normal respiratory effort without tachypnea or retractions. Lungs CTAB with breath sounds noted in all lung fields Musculoskeletal: No lower extremity tenderness nor edema.  No joint effusions. Neurologic:  Normal speech and language. No gross focal neurologic deficits are appreciated.  Skin:  5 cm annular erythematous area of cellulitis noted about the right inner thigh with a central punctate lesion. No necrotic skin nor bullous lesions are noted. No induration or fluctuance beneath the skin. No streaking. Area is slightly warm to palpation with mild pain. No active oozing or weeping. Skin is warm, dry and intact.  Psychiatric: Mood and affect are normal. Speech and behavior are normal. Patient exhibits appropriate insight and judgement.   ____________________________________________   LABS (all labs ordered are listed, but only abnormal results are displayed)  Labs Reviewed  BASIC METABOLIC PANEL - Abnormal; Notable for the following:       Result Value   Creatinine, Ser 1.09 (*)    All other components within normal limits  CBC WITH DIFFERENTIAL/PLATELET   ____________________________________________  EKG  None ____________________________________________  RADIOLOGY  None ____________________________________________    PROCEDURES  Procedure(s) performed: None      Medications  clindamycin (CLEOCIN) IVPB 600 mg (0 mg Intravenous Stopped 01/04/16 1452)  sodium chloride 0.9 % bolus 1,000 mL (0 mLs Intravenous Stopped 01/04/16 1603)     ____________________________________________   INITIAL IMPRESSION / ASSESSMENT AND PLAN / ED COURSE  Pertinent labs & imaging results that were available during my care of the patient were reviewed by me and considered in my medical decision making (see chart for details).  Patient's diagnosis is consistent with  Cellulitis right lower extremity due to spider bite. Patient will be discharged home with prescriptions for Keflex to take as directed in conjunction with finishing current prescription of Bactrim DS as previously prescribed. Patient is to follow up with her primary care provider if symptoms persist past this treatment course. Patient is given ED precautions to return to the ED for any worsening or new symptoms.      ____________________________________________  FINAL CLINICAL IMPRESSION(S) / ED DIAGNOSES  Final diagnoses:  Cellulitis of right lower extremity  Spider bite, accidental or unintentional, initial encounter      NEW MEDICATIONS STARTED DURING THIS VISIT:  Discharge Medication List as of 01/04/2016  3:35 PM    START taking these medications   Details  cephALEXin (KEFLEX) 500 MG capsule Take 1 capsule (500 mg total) by mouth 4 (four) times daily., Starting Sun 01/04/2016, Until Wed 01/14/2016, Print             Ernestene Kiel [place], PA-C 01/04/16 1629    Jeanmarie Plant, MD 01/05/16 [phone]

## 2016-01-12 ENCOUNTER — Encounter: Payer: Self-pay | Admitting: Family Medicine

## 2016-01-26 ENCOUNTER — Encounter: Payer: Self-pay | Admitting: Family Medicine

## 2016-01-26 ENCOUNTER — Ambulatory Visit (INDEPENDENT_AMBULATORY_CARE_PROVIDER_SITE_OTHER): Payer: BLUE CROSS/BLUE SHIELD | Admitting: Family Medicine

## 2016-01-26 VITALS — BP 102/64 | HR 58 | Temp 98.1°F | Ht 63.5 in | Wt 134.0 lb

## 2016-01-26 DIAGNOSIS — F909 Attention-deficit hyperactivity disorder, unspecified type: Secondary | ICD-10-CM

## 2016-01-26 DIAGNOSIS — Z0000 Encounter for general adult medical examination without abnormal findings: Secondary | ICD-10-CM | POA: Diagnosis not present

## 2016-01-26 DIAGNOSIS — Z23 Encounter for immunization: Secondary | ICD-10-CM

## 2016-01-26 DIAGNOSIS — F988 Other specified behavioral and emotional disorders with onset usually occurring in childhood and adolescence: Secondary | ICD-10-CM

## 2016-01-26 MED ORDER — TETANUS-DIPHTH-ACELL PERTUSSIS 5-2.5-18.5 LF-MCG/0.5 IM SUSP
0.5000 mL | Freq: Once | INTRAMUSCULAR | Status: AC
  Administered 2016-01-26: 0.5 mL via INTRAMUSCULAR

## 2016-01-26 NOTE — Progress Notes (Signed)
Subjective:    Patient ID: Kristy BisonCourtney M Cross, female    DOB: 09/08/1985, 30 y.o.   MRN: [id number]  HPI Here for health maintenance exam and to review chronic medical problems    Has been ok overall  [place] [employer] [place] [employer]   Wt Readings from Last 3 Encounters:  01/26/16 134 lb (60.8 kg)  01/04/16 130 lb (59 kg)  10/21/14 152 lb (68.9 kg)  is eating healthy and getting enough exercise  Started working out with coworkers and walking  bmi is 23   HIV screening -not interested   Pap per pt - was jan 2016 gyn (does not get every year)  Self breast exam-no lumps   Tetanus shot 11/05 Will get a Tdap   Flu shots-decline   Results for orders placed or performed during the hospital encounter of 01/04/16  CBC with Differential  Result Value Ref Range   WBC 7.9 3.6 - 11.0 K/uL   RBC 4.57 3.80 - 5.20 MIL/uL   Hemoglobin 14.8 12.0 - 16.0 g/dL   HCT 40.941.5 81.135.0 - 91.447.0 %   MCV 90.8 80.0 - 100.0 fL   MCH 32.3 26.0 - 34.0 pg   MCHC 35.6 32.0 - 36.0 g/dL   RDW 78.213.4 95.611.5 - 21.314.5 %   Platelets 167 150 - 440 K/uL   Neutrophils Relative % 61 %   Neutro Abs 4.9 1.4 - 6.5 K/uL   Lymphocytes Relative 31 %   Lymphs Abs 2.5 1.0 - 3.6 K/uL   Monocytes Relative 5 %   Monocytes Absolute 0.4 0.2 - 0.9 K/uL   Eosinophils Relative 2 %   Eosinophils Absolute 0.1 0 - 0.7 K/uL   Basophils Relative 1 %   Basophils Absolute 0.1 0 - 0.1 K/uL  Basic metabolic panel  Result Value Ref Range   Sodium 139 135 - 145 mmol/L   Potassium 4.3 3.5 - 5.1 mmol/L   Chloride 106 101 - 111 mmol/L   CO2 26 22 - 32 mmol/L   Glucose, Bld 74 65 - 99 mg/dL   BUN 12 6 - 20 mg/dL   Creatinine, Ser 0.861.09 (H) 0.44 - 1.00 mg/dL   Calcium 9.2 8.9 - 57.810.3 mg/dL   GFR calc non Af Amer >60 >60 mL/min   GFR calc Af Amer >60 >60 mL/min   Anion gap 7 5 - 15   (had labs from ED visit for insect bite and cellulis)  Lab Results  Component Value Date   CHOL 153 10/28/2008   HDL 81.10 10/28/2008   LDLCALC 65 10/28/2008   TRIG 36.0 10/28/2008   CHOLHDL 2 10/28/2008    Lab Results  Component Value Date   TSH 1.28 01/18/2013    Due for thyroid and cholesterol   adderall for ADD- working well /no problems    Patient Active Problem List   Diagnosis Date Noted  . Routine general medical examination at a health care facility 01/26/2016  . Palpitations 01/18/2013  . Anal fissure 06/02/2011  . Attention deficit disorder 11/24/2006  . ALLERGIC RHINITIS 11/21/2006  . OVARIAN CYST 11/21/2006  . 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 NEPHROLITHIASIS, HX OF 11/21/2006   Past Medical History:  Diagnosis Date  . ADD (attention deficit disorder)   . Allergic rhinitis   . Endometriosis   . Headache(784.0)   . Other acne   . Other and unspecified ovarian cyst   . Personal history of urinary calculi    Past Surgical History:  Procedure Laterality  Date  . APPENDECTOMY  3/07  . DILATION AND CURETTAGE OF UTERUS  4/10   miscarriage  . LAPAROSCOPY  2006   pelvic--endometriosis  . MOLE REMOVAL     ? atypical   Social History  Substance Use Topics  . Smoking status: Never Smoker  . Smokeless tobacco: Never Used  . Alcohol use 0.0 oz/week     Comment: rare   Family History  Problem Relation Age of Onset  . Colon polyps Father   . Other Father     Brain tumor  . Depression Mother    Allergies  Allergen Reactions  . Fexofenadine-Pseudoephed Er     REACTION: ? reaction  . Pseudoephedrine     REACTION: reaction not known   Current Outpatient Prescriptions on File Prior to Visit  Medication Sig Dispense Refill  . amphetamine-dextroamphetamine (ADDERALL XR) 30 MG 24 hr capsule Take 1 capsule (30 mg total) by mouth every morning. 30 capsule 0  . metoprolol tartrate (LOPRESSOR) 25 MG tablet Take 0.5 tablets (12.5 mg total) by mouth 2 (two) times daily. 30 tablet 6   No current facility-administered medications on file prior to visit.     Review of Systems Review of Systems  Constitutional: Negative for  fever, appetite change, fatigue and unexpected weight change.  Eyes: Negative for pain and visual disturbance.  Respiratory: Negative for cough and shortness of breath.   Cardiovascular: Negative for cp or palpitations    Gastrointestinal: Negative for nausea, diarrhea and constipation.  Genitourinary: Negative for urgency and frequency.  Skin: Negative for pallor or rash   Neurological: Negative for weakness, light-headedness, numbness and headaches.  Hematological: Negative for adenopathy. Does not bruise/bleed easily.  Psychiatric/Behavioral: Negative for dysphoric mood. The patient is not nervous/anxious.         Objective:   Physical Exam  Constitutional: She appears well-developed and well-nourished. No distress.  Well appearing   HENT:  Head: Normocephalic and atraumatic.  Right Ear: External ear normal.  Left Ear: External ear normal.  Mouth/Throat: Oropharynx is clear and moist.  Eyes: Conjunctivae and EOM are normal. Pupils are equal, round, and reactive to light. No scleral icterus.  Neck: Normal range of motion. Neck supple. No JVD present. Carotid bruit is not present. No thyromegaly present.  Cardiovascular: Normal rate, regular rhythm, normal heart sounds and intact distal pulses.  Exam reveals no gallop.   Pulmonary/Chest: Effort normal and breath sounds normal. No respiratory distress. She has no wheezes. She exhibits no tenderness.  Abdominal: Soft. Bowel sounds are normal. She exhibits no distension, no abdominal bruit and no mass. There is no tenderness.  Genitourinary: No breast swelling, tenderness, discharge or bleeding.  Genitourinary Comments: Breast exam: No mass, nodules, thickening, tenderness, bulging, retraction, inflamation, nipple discharge or skin changes noted.  No axillary or clavicular LA.      Musculoskeletal: Normal range of motion. She exhibits no edema or tenderness.  Lymphadenopathy:    She has no cervical adenopathy.  Neurological: She is  alert. She has normal reflexes. No cranial nerve deficit. She exhibits normal muscle tone. Coordination normal.  Skin: Skin is warm and dry. No rash noted. No erythema. No pallor.  Psychiatric: She has a normal mood and affect.          Assessment & Plan:   Problem List Items Addressed This Visit      Other   Routine general medical examination at a health care facility    Reviewed health habits including diet and exercise  and skin cancer prevention Reviewed appropriate screening tests for age  Also reviewed health mt list, fam hx and immunization status , as well as social and family history   See HPI Labs today for lipid/thyroid Declines HIV screen  Rev labs from ED (chem and cbc)-nl last mo  Is utd pap from gyn Breast exam done here today nl  Enc flu shot in the fall Tdap today       Relevant Orders   TSH   Lipid panel   Attention deficit disorder - Primary    Doing very well with current adderall xr dose of 30 mg  No problems or side eff Will continue this         Other Visit Diagnoses    Need for Tdap vaccination       Relevant Medications   Tdap (BOOSTRIX) injection 0.5 mL (Completed)

## 2016-01-26 NOTE — Patient Instructions (Addendum)
Tdap vaccine today  Labs today  Think about getting a flu shot in the fall   Take care of yourself  Keep exercising !

## 2016-01-26 NOTE — Progress Notes (Signed)
Pre visit review using our clinic review tool, if applicable. No additional management support is needed unless otherwise documented below in the visit note. 

## 2016-01-26 NOTE — Assessment & Plan Note (Signed)
Doing very well with current adderall xr dose of 30 mg  No problems or side eff Will continue this

## 2016-01-26 NOTE — Assessment & Plan Note (Signed)
Reviewed health habits including diet and exercise and skin cancer prevention Reviewed appropriate screening tests for age  Also reviewed health mt list, fam hx and immunization status , as well as social and family history   See HPI Labs today for lipid/thyroid Declines HIV screen  Rev labs from ED (chem and cbc)-nl last mo  Is utd pap from gyn Breast exam done here today nl  Enc flu shot in the fall Tdap today

## 2016-01-27 LAB — LIPID PANEL
CHOL/HDL RATIO: 2
Cholesterol: 134 mg/dL (ref 0–200)
HDL: 72.4 mg/dL (ref >39.00)
LDL CALC: 53 mg/dL (ref 0–99)
NonHDL: 61.62
TRIGLYCERIDES: 43 mg/dL (ref 0.0–149.0)
VLDL: 8.6 mg/dL (ref 0.0–40.0)

## 2016-01-27 LAB — TSH: TSH: 1.59 u[IU]/mL (ref 0.35–4.50)

## 2016-02-23 ENCOUNTER — Other Ambulatory Visit: Payer: Self-pay | Admitting: Family Medicine

## 2016-02-24 MED ORDER — AMPHETAMINE-DEXTROAMPHET ER 30 MG PO CP24: 30.0000 mg | ORAL_CAPSULE | ORAL | 0 refills | Status: DC

## 2016-02-24 NOTE — Telephone Encounter (Signed)
Pt had CPE on 01/26/16, last filled on 01/02/16 #30 with 0 refills, please advise

## 2016-02-24 NOTE — Telephone Encounter (Signed)
Px printed for pick up in IN box  

## 2016-02-24 NOTE — Telephone Encounter (Signed)
Pt notified Rx ready for pickup 

## 2016-04-08 ENCOUNTER — Other Ambulatory Visit: Payer: Self-pay | Admitting: Family Medicine

## 2016-04-08 NOTE — Telephone Encounter (Signed)
Last office visit 01/26/2016.  Last refilled 02/24/2016 for #30 with no refills.  Ok to refill?

## 2016-04-09 NOTE — Telephone Encounter (Signed)
Can't do it until I'm back on Monday- please let her know and route it back (or print and I will sign) thanks

## 2016-04-12 MED ORDER — AMPHETAMINE-DEXTROAMPHET ER 30 MG PO CP24: 30.0000 mg | ORAL_CAPSULE | ORAL | 0 refills | Status: DC

## 2016-04-12 NOTE — Telephone Encounter (Signed)
Px printed for pick up in IN box  

## 2016-04-12 NOTE — Telephone Encounter (Signed)
Left voicemail letting pt know Rx ready for pick up 

## 2016-06-09 ENCOUNTER — Other Ambulatory Visit: Payer: Self-pay | Admitting: Family Medicine

## 2016-06-09 MED ORDER — AMPHETAMINE-DEXTROAMPHET ER 30 MG PO CP24: 30.0000 mg | ORAL_CAPSULE | ORAL | 0 refills | Status: DC

## 2016-06-09 NOTE — Telephone Encounter (Signed)
Pt notified Rx ready for pickup 

## 2016-06-09 NOTE — Telephone Encounter (Signed)
CPE on 01/26/16, last filled on 04/12/16 #30 caps with 0 refill, please advise

## 2016-06-09 NOTE — Telephone Encounter (Signed)
Px printed for pick up in IN box  

## 2016-07-21 ENCOUNTER — Other Ambulatory Visit: Payer: Self-pay | Admitting: Family Medicine

## 2016-07-21 MED ORDER — AMPHETAMINE-DEXTROAMPHET ER 30 MG PO CP24: 30.0000 mg | ORAL_CAPSULE | ORAL | 0 refills | Status: DC

## 2016-07-21 NOTE — Telephone Encounter (Signed)
Px printed for pick up in IN box  

## 2016-07-21 NOTE — Telephone Encounter (Signed)
Last CPE was 01/26/16, last filled on 05/30/16 #30 caps with 0 refills, please advise

## 2016-07-22 NOTE — Telephone Encounter (Signed)
Called pt and no answer and pt's voicemail box is full, so I sent her a mychart letting pt know Rx ready for pick up

## 2016-09-27 ENCOUNTER — Ambulatory Visit: Payer: Self-pay | Admitting: Medical

## 2016-09-27 ENCOUNTER — Encounter: Payer: Self-pay | Admitting: Medical

## 2016-09-27 VITALS — BP 118/70 | HR 72 | Temp 97.5°F | Resp 16 | Ht 63.0 in | Wt 139.0 lb

## 2016-09-27 DIAGNOSIS — J0100 Acute maxillary sinusitis, unspecified: Secondary | ICD-10-CM

## 2016-09-27 DIAGNOSIS — J040 Acute laryngitis: Secondary | ICD-10-CM

## 2016-09-27 MED ORDER — AMOXICILLIN-POT CLAVULANATE 875-125 MG PO TABS: 1.0000 | ORAL_TABLET | Freq: Two times a day (BID) | ORAL | 0 refills | Status: DC

## 2016-09-27 NOTE — Patient Instructions (Signed)
Rest, Increase fluids, return to the clinic in 5-7 days if not improving. Use otc flonase  Take as directed.

## 2016-09-27 NOTE — Progress Notes (Signed)
   Subjective:    Patient ID: [name], female    DOB: 16-Aug-1985, 31 y.o.   MRN: [id number]  HPI31 yo female with 2 weeks with  Nasal congestion, post nasal drip, sinus pressure on cheek bones, dry scratchy throat, voice lost on Thursday, then slowly returning today. Coughing productive green and green nasal discharge.  Taking  otc mucinex and ibuprofen.    Review of Systems  Constitutional: Negative for chills and unexpected weight change.  HENT: Positive for congestion, postnasal drip, rhinorrhea, sinus pain, sinus pressure, sore throat, tinnitus, trouble swallowing and voice change. Negative for ear pain and nosebleeds.   Eyes: Negative.   Respiratory: Positive for cough. Negative for shortness of breath and wheezing.   Cardiovascular: Negative.   Gastrointestinal: Negative.   Endocrine: Negative.   Genitourinary: Negative.   Musculoskeletal: Negative.   Allergic/Immunologic: Positive for environmental allergies. Negative for food allergies.  Neurological: Negative.   Hematological: Positive for adenopathy. Does not bruise/bleed easily.  Psychiatric/Behavioral: Negative.   swallowing trouble due to soreness of throat.     Objective:   Physical Exam  Constitutional: She is oriented to person, place, and time. She appears well-developed and well-nourished.  HENT:  Head: Normocephalic and atraumatic.  Right Ear: Hearing, tympanic membrane, external ear and ear canal normal.  Left Ear: Hearing, tympanic membrane, external ear and ear canal normal.  Nose: Mucosal edema and rhinorrhea present.  Mouth/Throat: Uvula is midline, oropharynx is clear and moist and mucous membranes are normal.  Eyes: Conjunctivae and EOM are normal. Pupils are equal, round, and reactive to light.  Neck: Normal range of motion. Neck supple.  Cardiovascular: Normal rate, regular rhythm and normal heart sounds.  Exam reveals no gallop and no friction rub.   No murmur heard. Pulmonary/Chest: Effort  normal and breath sounds normal.  Musculoskeletal: Normal range of motion.  Lymphadenopathy:    She has cervical adenopathy.  Neurological: She is alert and oriented to person, place, and time.  Skin: Skin is warm and dry.  Psychiatric: She has a normal mood and affect. Her behavior is normal.  Nursing note and vitals reviewed.  Nasal discharge noted on left side  is green. Bilateral turbinate swelling noted.       Assessment & Plan:  Sinusitis prescribed Augmentin  one tablet by mouth twice daily  X 10 days #20 no refill Laryngitis improving. Continue otc ibuprofen and mucinex take as directed.  Return to the clinic in  5-7 days if not improving. No questions at discharge.

## 2016-12-02 ENCOUNTER — Ambulatory Visit (INDEPENDENT_AMBULATORY_CARE_PROVIDER_SITE_OTHER): Payer: BLUE CROSS/BLUE SHIELD | Admitting: Obstetrics and Gynecology

## 2016-12-02 ENCOUNTER — Encounter: Payer: Self-pay | Admitting: Obstetrics and Gynecology

## 2016-12-02 VITALS — BP 103/77 | HR 93 | Wt 137.0 lb

## 2016-12-02 DIAGNOSIS — F3281 Premenstrual dysphoric disorder: Secondary | ICD-10-CM | POA: Diagnosis not present

## 2016-12-02 MED ORDER — FLUOXETINE HCL 10 MG PO CAPS: 10.0000 mg | ORAL_CAPSULE | Freq: Every day | ORAL | 3 refills | Status: DC

## 2016-12-02 MED ORDER — MEDROXYPROGESTERONE ACETATE 150 MG/ML IM SUSP: 150.0000 mg | INTRAMUSCULAR | 4 refills | Status: DC

## 2016-12-02 NOTE — Patient Instructions (Signed)
Fluoxetine capsules or tablets (PMDD indication) What is this medicine? FLUOXETINE (floo OX e teen) belongs to a class of drugs known as selective serotonin reuptake inhibitors (SSRIs). It is used for premenstrual dysphoric disorder (PMDD). PMDD causes intense mood and physical symptoms a week or two before your period every month. This drug helps improve mood swings, tiredness, tension, and breast tenderness. This medicine may be used for other purposes; ask your health care provider or pharmacist if you have questions. COMMON BRAND NAME(S): Prozac, Sarafem, Selfemra What should I tell my health care provider before I take this medicine? They need to know if you have any of these conditions: -bipolar disorder or a family history of bipolar disorder -bleeding disorders -glaucoma -heart disease -liver disease -low levels of sodium in the blood -seizures -suicidal thoughts, plans, or attempt; a previous suicide attempt by you or a family member -take MAOIs like Carbex, Eldepryl, Marplan, Nardil, and Parnate -take medicines that treat or prevent blood clots -thyroid disease -an unusual or allergic reaction to fluoxetine, other medicines, foods, dyes, or preservatives -pregnant or trying to get pregnant -breast-feeding -bipolar disorder or a family history of bipolar disorder -bleeding disorders -glaucoma -heart disease -liver disease -low levels of sodium in the blood -seizures -suicidal thoughts, plans, or attempt; a previous suicide attempt by you or a family member -take MAOIs like Carbex, Eldepryl, Marplan, Nardil, and Parnate -take medicines that treat or prevent blood clots -thyroid disease -an unusual or allergic reaction to fluoxetine, other medicines, foods, dyes, or preservatives -pregnant or trying to get pregnant -breast-feeding How should I use this medicine? Take this medicine by mouth with a glass of water. Follow the directions on the prescription label. You can take  it with or without food. Take your medicine at regular intervals. Do not take it more often than directed. Do not stop taking this medicine suddenly except upon the advice of your doctor. Stopping this medicine too quickly may cause serious side effects or your condition may worsen. A special MedGuide will be given to you by the pharmacist with each prescription and refill. Be sure to read this information carefully each time. Talk to your pediatrician regarding the use of this medicine in children. Special care may be needed. Overdosage: If you think you have taken too much of this medicine contact a poison control center or emergency room at once. NOTE: This medicine is only for you. Do not share this medicine with others. What if I miss a dose? If you miss a dose, skip the missed dose and go back to your regular dosing schedule. Do not take double or extra doses. What may interact with this medicine? Do not take this medicine with any of the following medications: -other medicines containing fluoxetine, like Prozac or Symbyax -cisapride -linezolid -MAOIs like Carbex, Eldepryl, Marplan, Nardil, and Parnate -methylene blue (injected into a vein) -pimozide -thioridazine This medicine may also interact with the following medications: -alcohol -amphetamines -aspirin and aspirin-like medicines -carbamazepine -certain medicines for depression, anxiety, or psychotic disturbances -certain medicines for migraine headaches like almotriptan, eletriptan, frovatriptan, naratriptan, rizatriptan, sumatriptan, zolmitriptan -digoxin -diuretics -fentanyl -flecainide -furazolidone -isoniazid -lithium -medicines for sleep -medicines that treat or prevent blood clots like warfarin, enoxaparin, and dalteparin -NSAIDs, medicines for pain and inflammation, like ibuprofen or naproxen -phenytoin -procarbazine -propafenone -rasagiline -ritonavir -supplements like St. John's wort, kava kava,  valerian -tramadol -tryptophan -vinblastine This list may not describe all possible interactions. Give your health care provider a list of all the medicines, herbs,  non-prescription drugs, or dietary supplements you use. Also tell them if you smoke, drink alcohol, or use illegal drugs. Some items may interact with your medicine. What should I watch for while using this medicine? Tell your doctor if your symptoms do not get better or if they get worse. Visit your doctor or health care professional for regular checks on your progress. Patients and their families should watch out for new or worsening thoughts of suicide or depression. Also watch out for sudden changes in feelings such as feeling anxious, agitated, panicky, irritable, hostile, aggressive, impulsive, severely restless, overly excited and hyperactive, or not being able to sleep. If this happens, especially at the beginning of treatment or after a change in dose, call your health care professional. Bonita QuinYou may get drowsy or dizzy. Do not drive, use machinery, or do anything that needs mental alertness until you know how this medicine affects you. Do not stand or sit up quickly, especially if you are an older patient. This reduces the risk of dizzy or fainting spells. Alcohol may interfere with the effect of this medicine. Avoid alcoholic drinks. Your mouth may get dry. Chewing sugarless gum or sucking hard candy, and drinking plenty of water may help. Contact your doctor if the problem does not go away or is severe. This medicine may affect blood sugar levels. If you have diabetes, check with your doctor or health care professional before you change your diet or the dose of your diabetic medicine. What side effects may I notice from receiving this medicine? Side effects that you should report to your doctor or health care professional as soon as possible: -allergic reactions like skin rash, itching or hives, swelling of the face, lips, or  tongue -anxious -black, tarry stools -breathing problems -changes in vision -confusion -elevated mood, decreased need for sleep, racing thoughts, impulsive behavior -eye pain -fast, irregular heartbeat -feeling faint or lightheaded, falls -feeling agitated, angry, or irritable -hallucination, loss of contact with reality -loss of balance or coordination -loss of memory -restlessness, pacing, inability to keep still -seizures -stiff muscles -suicidal thoughts or other mood changes -trouble sleeping -unusual bleeding or bruising -unusually weak or tired -vomiting Side effects that usually do not require medical attention (report to your doctor or health care professional if they continue or are bothersome): -change in appetite or weight -change in sex drive or performance -diarrhea -dry mouth -headache -increased sweating -indigestion, nausea -tremors This list may not describe all possible side effects. Call your doctor for medical advice about side effects. You may report side effects to FDA at 1-800-FDA-1088. Where should I keep my medicine? Keep out of the reach of children. Store at room temperature between 15 and 30 degrees C (59 and 86 degrees F). Throw away any unused medicine after the expiration date. NOTE: This sheet is a summary. It may not cover all possible information. If you have questions about this medicine, talk to your doctor, pharmacist, or health care provider.  2018 Elsevier/Gold Standard (2015-11-01 15:59:42) Premenstrual Syndrome Premenstrual syndrome (PMS) is a group of physical, emotional, and behavioral symptoms that affect women of childbearing age. PMS starts 1-2 weeks before the start of a woman's period and goes away a few days after the period starts. It often recurs in a predictable pattern. PMS can range from mild to severe. When it is severe, it is called premenstrual dysphoric disorder (PMDD). PMS can interfere in many ways with normal daily  activities. What are the causes? The cause of this condition is  not known, but it seems to be related to hormone changes that happen before menstruation. What are the signs or symptoms? Symptoms of this condition often happen every month. They go away completely after your period starts. Physical symptoms include:  Bloating.  Breast pain.  Headaches.  Extreme fatigue.  Backaches.  Swelling of the hands and feet.  Weight gain.  Hot flashes.  Emotional and behavioral symptoms include:  Mood swings.  Depression.  Angry outbursts.  Irritability.  Anxiety.  Crying spells.  Food cravings or appetite changes.  Changes in sexual desire.  Confusion.  Aggression.  Social withdrawal.  Poor concentration.  How is this diagnosed? This condition is diagnosed if symptoms of PMS:  Are present in the 5 days before your period starts.  End within 4 days after your period starts.  Happen at least 3 months in a row.  Interfere with some of your normal activities.  Other conditions that can cause some of these symptoms must be ruled out before PMS can be diagnosed. How is this treated? This condition may be treated by:  Maintaining a healthy lifestyle. This includes eating a balanced diet and exercising regularly.  Taking medicines. Medicines can help relieve symptoms such as cramps, aches, pains, headaches, and breast tenderness. Depending on the severity of the condition, your health care provider may recommend: ? Over-the-counter pain medicines. ? Prescription medicines for PMDD.  Follow these instructions at home: Eating and drinking   Eat a well-balanced diet.  Avoid caffeine and alcohol.  Limit the amount of salt and salty foods you eat. This will help lessen bloating.  Drink enough fluid to keep your urine clear or pale yellow.  Take a multivitamin if told to by your health care provider. Lifestyle  Do not use any tobacco products, such as  cigarettes, chewing tobacco, and e-cigarettes. If you need help quitting, ask your health care provider.  Exercise regularly as suggested by your health care provider.  Get enough sleep.  Practice relaxation techniques.  Limit stress. Other Instructions  For 2-3 months, write down your symptoms, their severity, and how long they last. This will help your health care provider choose the best treatment for you.  Take over-the-counter and prescription medicines only as told by your health care provider.  If you are using oral contraceptive pills, use them as told by your health care provider. This information is not intended to replace advice given to you by your health care provider. Make sure you discuss any questions you have with your health care provider. Document Released: 05/28/2000 Document Revised: 07/02/2015 Document Reviewed: 02/28/2015 Elsevier Interactive Patient Education  [employer].

## 2016-12-02 NOTE — Progress Notes (Signed)
Subjective:     Patient ID: [name], female   DOB: 1985/07/15, 31 y.o.   MRN: [id number]  HPI Here to discuss worsening mood swings with irritability before menses. Reports sudden onset of aggitation and anger/moodiness about 4-7 days before each menses monthly that last for 1-2 days into menses. Denies pain or heavy bleeding with menses, although has previous history of endometriosis. States it has been going on since last delivery but worse for last year.  Review of Systems    negative except stated above in HPI, negative depression screening today Objective:   Physical Exam A&Ox4 Well groomed female in no distress Blood pressure 103/77, pulse 93, weight 137 lb (62.1 kg), last menstrual period 11/22/2016. Body mass index is 24.27 kg/m.  PE not indicated     Assessment:     PMDD    Plan:     Discussed treatment options including SSRI use as needed and hormonal suppression, she is interested in both. Will start on prozac 10mg  in 4 days taking nightly until 2 days into menses. Also to return next week for first depo shot (rx sent in).  Will follow up at [facility] due in September unless needed before then.   >50% of 10 minute visit spent in counseling.  Letti Towell [place], CNM

## 2016-12-09 ENCOUNTER — Ambulatory Visit (INDEPENDENT_AMBULATORY_CARE_PROVIDER_SITE_OTHER): Payer: BLUE CROSS/BLUE SHIELD | Admitting: Obstetrics and Gynecology

## 2016-12-09 VITALS — BP 109/83 | HR 91 | Wt 134.9 lb

## 2016-12-09 DIAGNOSIS — Z3042 Encounter for surveillance of injectable contraceptive: Secondary | ICD-10-CM | POA: Diagnosis not present

## 2016-12-09 DIAGNOSIS — F3281 Premenstrual dysphoric disorder: Secondary | ICD-10-CM

## 2016-12-09 LAB — POCT URINE PREGNANCY: Preg Test, Ur: NEGATIVE

## 2016-12-09 MED ORDER — MEDROXYPROGESTERONE ACETATE 150 MG/ML IM SUSP
150.0000 mg | Freq: Once | INTRAMUSCULAR | Status: AC
  Administered 2016-12-09: 150 mg via INTRAMUSCULAR

## 2016-12-09 NOTE — Progress Notes (Signed)
Patient ID: Kristy BisonCourtney M Cross, female   DOB: [birth year], 31 y.o.   MRN: [id number] Pt presents for Depo-Provera injection IM for PMDD.  Took depo previously but this starts a new cycle. UPT-Negative. Depo-Provera given IM, RUOQ buttocks. To return for next injection 9/13 thru 03/10/2017. Pt states she has no more refills at check out. I called pt and informed her she should have 4 refills.

## 2017-02-17 ENCOUNTER — Encounter: Payer: Self-pay | Admitting: Obstetrics and Gynecology

## 2017-02-17 ENCOUNTER — Ambulatory Visit (INDEPENDENT_AMBULATORY_CARE_PROVIDER_SITE_OTHER): Payer: BLUE CROSS/BLUE SHIELD | Admitting: Obstetrics and Gynecology

## 2017-02-17 ENCOUNTER — Other Ambulatory Visit: Payer: Self-pay | Admitting: Obstetrics and Gynecology

## 2017-02-17 VITALS — BP 128/81 | HR 109 | Ht 63.0 in | Wt 140.2 lb

## 2017-02-17 DIAGNOSIS — Z01419 Encounter for gynecological examination (general) (routine) without abnormal findings: Secondary | ICD-10-CM | POA: Diagnosis not present

## 2017-02-17 MED ORDER — LEVONORGEST-ETH ESTRAD 91-DAY 0.1-0.02 & 0.01 MG PO TABS: 1.0000 | ORAL_TABLET | Freq: Every day | ORAL | 4 refills | Status: DC

## 2017-02-17 NOTE — Patient Instructions (Signed)
Preventive Care 18-39 Years, Female Preventive care refers to lifestyle choices and visits with your health care provider that can promote health and wellness. What does preventive care include?  A yearly physical exam. This is also called an annual well check.  Dental exams once or twice a year.  Routine eye exams. Ask your health care provider how often you should have your eyes checked.  Personal lifestyle choices, including: ? Daily care of your teeth and gums. ? Regular physical activity. ? Eating a healthy diet. ? Avoiding tobacco and drug use. ? Limiting alcohol use. ? Practicing safe sex. ? Taking vitamin and mineral supplements as recommended by your health care provider. What happens during an annual well check? The services and screenings done by your health care provider during your annual well check will depend on your age, overall health, lifestyle risk factors, and family history of disease. Counseling Your health care provider may ask you questions about your:  Alcohol use.  Tobacco use.  Drug use.  Emotional well-being.  Home and relationship well-being.  Sexual activity.  Eating habits.  Work and work Statistician.  Method of birth control.  Menstrual cycle.  Pregnancy history.  Screening You may have the following tests or measurements:  Height, weight, and BMI.  Diabetes screening. This is done by checking your blood sugar (glucose) after you have not eaten for a while (fasting).  Blood pressure.  Lipid and cholesterol levels. These may be checked every 5 years starting at age 38.  Skin check.  Hepatitis C blood test.  Hepatitis B blood test.  Sexually transmitted disease (STD) testing.  BRCA-related cancer screening. This may be done if you have a family history of breast, ovarian, tubal, or peritoneal cancers.  Pelvic exam and Pap test. This may be done every 3 years starting at age 38. Starting at age 30, this may be done  every 5 years if you have a Pap test in combination with an HPV test.  Discuss your test results, treatment options, and if necessary, the need for more tests with your health care provider. Vaccines Your health care provider may recommend certain vaccines, such as:  Influenza vaccine. This is recommended every year.  Tetanus, diphtheria, and acellular pertussis (Tdap, Td) vaccine. You may need a Td booster every 10 years.  Varicella vaccine. You may need this if you have not been vaccinated.  HPV vaccine. If you are 39 or younger, you may need three doses over 6 months.  Measles, mumps, and rubella (MMR) vaccine. You may need at least one dose of MMR. You may also need a second dose.  Pneumococcal 13-valent conjugate (PCV13) vaccine. You may need this if you have certain conditions and were not previously vaccinated.  Pneumococcal polysaccharide (PPSV23) vaccine. You may need one or two doses if you smoke cigarettes or if you have certain conditions.  Meningococcal vaccine. One dose is recommended if you are age 68-21 years and a first-year college student living in a residence hall, or if you have one of several medical conditions. You may also need additional booster doses.  Hepatitis A vaccine. You may need this if you have certain conditions or if you travel or work in places where you may be exposed to hepatitis A.  Hepatitis B vaccine. You may need this if you have certain conditions or if you travel or work in places where you may be exposed to hepatitis B.  Haemophilus influenzae type b (Hib) vaccine. You may need this  if you have certain risk factors.  Talk to your health care provider about which screenings and vaccines you need and how often you need them. This information is not intended to replace advice given to you by your health care provider. Make sure you discuss any questions you have with your health care provider. Document Released: 07/27/2001 Document Revised:  02/18/2016 Document Reviewed: 04/01/2015 Elsevier Interactive Patient Education  2017 Elsevier Inc.  

## 2017-02-17 NOTE — Progress Notes (Signed)
Subjective:   [name] is a 31 y.o. G2P2 Caucasian female here for a routine well-woman exam.  No LMP recorded. Patient has had an injection.    Current complaints: weight gain since starting depo. Otherwise PMDD much better. PCP: Tomey       does desire labs  Social History: Sexual: heterosexual Marital Status: married Living situation: with family Occupation: unknown occupation Tobacco/alcohol: no tobacco use Illicit drugs: no history of illicit drug use  The following portions of the patient's history were reviewed and updated as appropriate: allergies, current medications, past family history, past medical history, past social history, past surgical history and problem list.  Past Medical History Past Medical History:  Diagnosis Date  . ADD (attention deficit disorder)   . Allergic rhinitis   . Endometriosis   . Headache(784.0)   . Other acne   . Other and unspecified ovarian cyst   . Personal history of urinary calculi     Past Surgical History Past Surgical History:  Procedure Laterality Date  . APPENDECTOMY  3/07  . DILATION AND CURETTAGE OF UTERUS  4/10   miscarriage  . LAPAROSCOPY  2006   pelvic--endometriosis  . MOLE REMOVAL     ? atypical    Gynecologic History G2P2  No LMP recorded. Patient has had an injection. Contraception: Depo-Provera injections Last Pap: 2016. Results were: normal   Obstetric History OB History  Gravida Para Term Preterm AB Living  SAB TAB Ectopic Multiple Live Births          2    # Outcome Date GA Lbr Len/2nd Weight Sex Delivery Anes PTL Lv  2 Para     F Vag-Spont   LIV  1 Para     M Vag-Spont   LIV      Current Medications Current Outpatient Prescriptions on File Prior to Visit  Medication Sig Dispense Refill  . EVEKEO 10 MG TABS Take 3 tablets by mouth 2 (two) times daily.  0  . FLUoxetine (PROZAC) 10 MG capsule Take 1 capsule (10 mg total) by mouth daily. 30 capsule 3  . medroxyPROGESTERone  (DEPO-PROVERA) 150 MG/ML injection Inject 1 mL (150 mg total) into the muscle every 3 (three) months. 1 mL 4   No current facility-administered medications on file prior to visit.     Review of Systems Patient denies any headaches, blurred vision, shortness of breath, chest pain, abdominal pain, problems with bowel movements, urination, or intercourse.  Objective:  BP 128/81   Pulse (!) 109   Ht  (1.6 m)   Wt 140 lb 3.2 oz (63.6 kg)   BMI 24.84 kg/m  Physical Exam  General:  Well developed, well nourished, no acute distress. She is alert and oriented x3. Skin:  Warm and dry Neck:  Midline trachea, no thyromegaly or nodules Cardiovascular: Regular rate and rhythm, no murmur heard Lungs:  Effort normal, all lung fields clear to auscultation bilaterally Breasts:  No dominant palpable mass, retraction, or nipple discharge Abdomen:  Soft, non tender, no hepatosplenomegaly or masses Pelvic:  External genitalia is normal in appearance.  The vagina is normal in appearance. The cervix is bulbous, no CMT.  Thin prep pap is done with HR HPV cotesting. Uterus is felt to be normal size, shape, and contour.  No adnexal masses or tenderness noted. Extremities:  No swelling or varicosities noted Psych:  She has a normal mood and affect  Assessment:  Healthy well-woman exam  Plan:  Labs obtained will follow up accordingly F/U 1 year for AE, or sooner if needed   Ralynn San Suzan NailerN Eamonn Sermeno, CNM

## 2017-02-18 LAB — COMPREHENSIVE METABOLIC PANEL
A/G RATIO: 1.8 (ref 1.2–2.2)
ALK PHOS: 57 IU/L (ref 39–117)
ALT: 9 IU/L (ref 0–32)
AST: 12 IU/L (ref 0–40)
Albumin: 4.8 g/dL (ref 3.5–5.5)
BUN/Creatinine Ratio: 13 (ref 9–23)
BUN: 11 mg/dL (ref 6–20)
Bilirubin Total: 0.4 mg/dL (ref 0.0–1.2)
CALCIUM: 9.1 mg/dL (ref 8.7–10.2)
CO2: 21 mmol/L (ref 20–29)
Chloride: 102 mmol/L (ref 96–106)
Creatinine, Ser: 0.88 mg/dL (ref 0.57–1.00)
GFR calc Af Amer: 101 mL/min/{1.73_m2} (ref >59)
GFR, EST NON AFRICAN AMERICAN: 88 mL/min/{1.73_m2} (ref >59)
GLOBULIN, TOTAL: 2.6 g/dL (ref 1.5–4.5)
Glucose: 80 mg/dL (ref 65–99)
POTASSIUM: 4.6 mmol/L (ref 3.5–5.2)
SODIUM: 140 mmol/L (ref 134–144)
Total Protein: 7.4 g/dL (ref 6.0–8.5)

## 2017-02-18 LAB — THYROID PANEL WITH TSH
FREE THYROXINE INDEX: 2.4 (ref 1.2–4.9)
T3 UPTAKE RATIO: 28 % (ref 24–39)
T4, Total: 8.6 ug/dL (ref 4.5–12.0)
TSH: 1.89 u[IU]/mL (ref 0.450–4.500)

## 2017-02-18 LAB — LIPID PANEL
CHOLESTEROL TOTAL: 141 mg/dL (ref 100–199)
Chol/HDL Ratio: 1.7 ratio (ref 0.0–4.4)
HDL: 82 mg/dL (ref >39)
LDL Calculated: 53 mg/dL (ref 0–99)
TRIGLYCERIDES: 31 mg/dL (ref 0–149)
VLDL Cholesterol Cal: 6 mg/dL (ref 5–40)

## 2017-02-18 LAB — CYTOLOGY - PAP

## 2017-03-04 ENCOUNTER — Ambulatory Visit: Payer: BLUE CROSS/BLUE SHIELD

## 2017-05-09 ENCOUNTER — Encounter: Payer: Self-pay | Admitting: Medical

## 2017-05-09 ENCOUNTER — Ambulatory Visit: Payer: Self-pay | Admitting: Medical

## 2017-05-09 VITALS — BP 90/68 | HR 107 | Temp 99.4°F | Ht 63.0 in | Wt 146.4 lb

## 2017-05-09 DIAGNOSIS — R059 Cough, unspecified: Secondary | ICD-10-CM

## 2017-05-09 DIAGNOSIS — J0100 Acute maxillary sinusitis, unspecified: Secondary | ICD-10-CM

## 2017-05-09 DIAGNOSIS — J069 Acute upper respiratory infection, unspecified: Secondary | ICD-10-CM

## 2017-05-09 DIAGNOSIS — R05 Cough: Secondary | ICD-10-CM

## 2017-05-09 MED ORDER — AMOXICILLIN-POT CLAVULANATE 875-125 MG PO TABS: 1.0000 | ORAL_TABLET | Freq: Two times a day (BID) | ORAL | 0 refills | Status: DC

## 2017-05-09 MED ORDER — BENZONATATE 100 MG PO CAPS: 100.0000 mg | ORAL_CAPSULE | Freq: Three times a day (TID) | ORAL | 0 refills | Status: DC | PRN

## 2017-05-09 NOTE — Patient Instructions (Signed)
Take medications as prescribed. Return in 3-5 days if not improving.   Cough, Adult A cough helps to clear your throat and lungs. A cough may last only 2-3 weeks (acute), or it may last longer than 8 weeks (chronic). Many different things can cause a cough. A cough may be a sign of an illness or another medical condition. Follow these instructions at home:  Pay attention to any changes in your cough.  Take medicines only as told by your doctor. ? If you were prescribed an antibiotic medicine, take it as told by your doctor. Do not stop taking it even if you start to feel better. ? Talk with your doctor before you try using a cough medicine.  Drink enough fluid to keep your pee (urine) clear or pale yellow.  If the air is dry, use a cold steam vaporizer or humidifier in your home.  Stay away from things that make you cough at work or at home.  If your cough is worse at night, try using extra pillows to raise your head up higher while you sleep.  Do not smoke, and try not to be around smoke. If you need help quitting, ask your doctor.  Do not have caffeine.  Do not drink alcohol.  Rest as needed. Contact a doctor if:  You have new problems (symptoms).  You cough up yellow fluid (pus).  Your cough does not get better after 2-3 weeks, or your cough gets worse.  Medicine does not help your cough and you are not sleeping well.  You have pain that gets worse or pain that is not helped with medicine.  You have a fever.  You are losing weight and you do not know why.  You have night sweats. Get help right away if:  You cough up blood.  You have trouble breathing.  Your heartbeat is very fast. This information is not intended to replace advice given to you by your health care provider. Make sure you discuss any questions you have with your health care provider. Document Released: 02/11/2011 Document Revised: 11/06/2015 Document Reviewed: 08/07/2014 Elsevier Interactive  Patient Education  2018 [employer] Upper Respiratory Infection, Adult Most upper respiratory infections (URIs) are caused by a virus. A URI affects the nose, throat, and upper air passages. The most common type of URI is often called "the common cold." Follow these instructions at home:  Take medicines only as told by your doctor.  Gargle warm saltwater or take cough drops to comfort your throat as told by your doctor.  Use a warm mist humidifier or inhale steam from a shower to increase air moisture. This may make it easier to breathe.  Drink enough fluid to keep your pee (urine) clear or pale yellow.  Eat soups and other clear broths.  Have a healthy diet.  Rest as needed.  Go back to work when your fever is gone or your doctor says it is okay. ? You may need to stay home longer to avoid giving your URI to others. ? You can also wear a face mask and wash your hands often to prevent spread of the virus.  Use your inhaler more if you have asthma.  Do not use any tobacco products, including cigarettes, chewing tobacco, or electronic cigarettes. If you need help quitting, ask your doctor. Contact a doctor if:  You are getting worse, not better.  Your symptoms are not helped by medicine.  You have chills.  You are getting more short of  breath.  You have brown or red mucus.  You have yellow or brown discharge from your nose.  You have pain in your face, especially when you bend forward.  You have a fever.  You have puffy (swollen) neck glands.  You have pain while swallowing.  You have white areas in the back of your throat. Get help right away if:  You have very bad or constant: ? Headache. ? Ear pain. ? Pain in your forehead, behind your eyes, and over your cheekbones (sinus pain). ? Chest pain.  You have long-lasting (chronic) lung disease and any of the following: ? Wheezing. ? Long-lasting cough. ? Coughing up blood. ? A change in your usual  mucus.  You have a stiff neck.  You have changes in your: ? Vision. ? Hearing. ? Thinking. ? Mood. This information is not intended to replace advice given to you by your health care provider. Make sure you discuss any questions you have with your health care provider. Document Released: 11/17/2007 Document Revised: 02/01/2016 Document Reviewed: 09/05/2013 Elsevier Interactive Patient Education  2018 [employer] Sinusitis, Adult Sinusitis is soreness and inflammation of your sinuses. Sinuses are hollow spaces in the bones around your face. They are located:  Around your eyes.  In the middle of your forehead.  Behind your nose.  In your cheekbones.  Your sinuses and nasal passages are lined with a stringy fluid (mucus). Mucus normally drains out of your sinuses. When your nasal tissues get inflamed or swollen, the mucus can get trapped or blocked so air cannot flow through your sinuses. This lets bacteria, viruses, and funguses grow, and that leads to infection. Follow these instructions at home: Medicines  Take, use, or apply over-the-counter and prescription medicines only as told by your doctor. These may include nasal sprays.  If you were prescribed an antibiotic medicine, take it as told by your doctor. Do not stop taking the antibiotic even if you start to feel better. Hydrate and Humidify  Drink enough water to keep your pee (urine) clear or pale yellow.  Use a cool mist humidifier to keep the humidity level in your home above 50%.  Breathe in steam for 10-15 minutes, 3-4 times a day or as told by your doctor. You can do this in the bathroom while a hot shower is running.  Try not to spend time in cool or dry air. Rest  Rest as much as possible.  Sleep with your head raised (elevated).  Make sure to get enough sleep each night. General instructions  Put a warm, moist washcloth on your face 3-4 times a day or as told by your doctor. This will help with  discomfort.  Wash your hands often with soap and water. If there is no soap and water, use hand sanitizer.  Do not smoke. Avoid being around people who are smoking (secondhand smoke).  Keep all follow-up visits as told by your doctor. This is important. Contact a doctor if:  You have a fever.  Your symptoms get worse.  Your symptoms do not get better within 10 days. Get help right away if:  You have a very bad headache.  You cannot stop throwing up (vomiting).  You have pain or swelling around your face or eyes.  You have trouble seeing.  You feel confused.  Your neck is stiff.  You have trouble breathing. This information is not intended to replace advice given to you by your health care provider. Make sure you discuss any  questions you have with your health care provider. Document Released: 11/17/2007 Document Revised: 01/25/2016 Document Reviewed: 03/26/2015 Elsevier Interactive Patient Education  [employer].

## 2017-05-09 NOTE — Progress Notes (Signed)
   Subjective:    Patient ID: Kristy BisonCourtney M Cross, female    DOB: 1986-03-29, 31 y.o.   MRN: [id number]  HPI 31 yo female in non acute distress, come in today with symptoms of  Cough productive, sore throat with coughing..  Taking Mucinex DM since Thursday , headache and pressure in the cheeks., both ears feel clogged , fever on Saturday of 102 degrees and none yesterday.Took Ibuprofen 600mg  at  3pm . History of sinus tachycardia cannot take sudafed. Last dose of Mucinex DM at  7 am.   Review of Systems  Constitutional: Positive for activity change, appetite change, chills, fatigue and fever.  HENT: Positive for congestion (improving), ear pain (right ear slight to moderate pain), postnasal drip, rhinorrhea, sinus pressure, sinus pain, sneezing and sore throat.   Eyes: Negative for discharge and itching.  Respiratory: Positive for cough. Negative for chest tightness and shortness of breath.   Cardiovascular: Positive for chest pain (with coughing). Negative for palpitations and leg swelling.  Gastrointestinal: Negative for abdominal pain.  Endocrine: Negative for cold intolerance and heat intolerance.  Genitourinary: Negative for dysuria.  Musculoskeletal: Negative for myalgias.  Skin: Negative for rash.  Allergic/Immunologic: Positive for environmental allergies. Negative for food allergies.  Neurological: Positive for headaches. Negative for dizziness and syncope.  Hematological: Negative for adenopathy.  Psychiatric/Behavioral: Negative for behavioral problems, self-injury and suicidal ideas. The patient is not nervous/anxious.        Objective:   Physical Exam  Constitutional: She is oriented to person, place, and time. She appears well-developed and well-nourished.  HENT:  Head: Normocephalic and atraumatic.  Right Ear: Hearing, external ear and ear canal normal. A middle ear effusion is present.  Left Ear: Hearing, external ear and ear canal normal. A middle ear effusion is present.   Nose: Mucosal edema (mild , erythema) present. Right sinus exhibits maxillary sinus tenderness (right side> left). Left sinus exhibits maxillary sinus tenderness.  Mouth/Throat: Uvula is midline, oropharynx is clear and moist and mucous membranes are normal.  Eyes: Conjunctivae and EOM are normal. Pupils are equal, round, and reactive to light.  Neck: Normal range of motion. Neck supple.  Cardiovascular: Normal rate, regular rhythm and normal heart sounds.  Pulmonary/Chest: Effort normal and breath sounds normal.  Lymphadenopathy:    She has cervical adenopathy.  Neurological: She is alert and oriented to person, place, and time.  Skin: Skin is warm and dry.  Psychiatric: She has a normal mood and affect. Her behavior is normal. Judgment and thought content normal.  Nursing note and vitals reviewed.   Mild cough noted in room. Heart rate recheck 90    Assessment & Plan:  Sinusitis, Upper Respiratory Infection/ Cough Reviewed with patient stop and  not to take OTC Mucinex DM may take plain Mucinex only. Return in  3-5 days if not improving or worsening.  Meds ordered this encounter  Medications  . amoxicillin-clavulanate (AUGMENTIN) 875-125 MG tablet    Sig: Take 1 tablet by mouth 2 (two) times daily.    Dispense:  10 tablet    Refill:  0  . benzonatate (TESSALON PERLES) 100 MG capsule    Sig: Take 1 capsule (100 mg total) by mouth 3 (three) times daily as needed for cough.    Dispense:  30 capsule    Refill:  0   Rest , increase fluids.  Patient verbalizes understanding and has no questions at discharge.

## 2017-07-18 ENCOUNTER — Ambulatory Visit: Payer: Self-pay | Admitting: Medical

## 2017-07-18 VITALS — BP 90/68 | HR 64 | Temp 99.6°F | Resp 18 | Wt 145.8 lb

## 2017-07-18 DIAGNOSIS — Z9109 Other allergy status, other than to drugs and biological substances: Secondary | ICD-10-CM

## 2017-07-18 DIAGNOSIS — J0100 Acute maxillary sinusitis, unspecified: Secondary | ICD-10-CM

## 2017-07-18 DIAGNOSIS — H6983 Other specified disorders of Eustachian tube, bilateral: Secondary | ICD-10-CM

## 2017-07-18 MED ORDER — AMOXICILLIN-POT CLAVULANATE 875-125 MG PO TABS
1.0000 | ORAL_TABLET | Freq: Two times a day (BID) | ORAL | 0 refills | Status: DC
Start: 2017-07-18 — End: 2017-10-07

## 2017-07-18 NOTE — Patient Instructions (Signed)
Use OTC Zyrtec and Flkonase take as directed.   Allergies An allergy is when your body reacts to a substance in a way that is not normal. An allergic reaction can happen after you:  Eat something.  Breathe in something.  Touch something.  You can be allergic to:  Things that are only around during certain seasons, like molds and pollens.  Foods.  Drugs.  Insects.  Animal dander.  What are the signs or symptoms?  Puffiness (swelling). This may happen on the lips, face, tongue, mouth, or throat.  Sneezing.  Coughing.  Breathing loudly (wheezing).  Stuffy nose.  Tingling in the mouth.  A rash.  Itching.  Itchy, red, puffy areas of skin (hives).  Watery eyes.  Throwing up (vomiting).  Watery poop (diarrhea).  Dizziness.  Feeling faint or fainting.  Trouble breathing or swallowing.  A tight feeling in the chest.  A fast heartbeat. How is this diagnosed? Allergies can be diagnosed with:  A medical and family history.  Skin tests.  Blood tests.  A food diary. A food diary is a record of all the foods, drinks, and symptoms you have each day.  The results of an elimination diet. This diet involves making sure not to eat certain foods and then seeing what happens when you start eating them again.  How is this treated? There is no cure for allergies, but allergic reactions can be treated with medicine. Severe reactions usually need to be treated at a hospital. How is this prevented? The best way to prevent an allergic reaction is to avoid the thing you are allergic to. Allergy shots and medicines can also help prevent reactions in some cases. This information is not intended to replace advice given to you by your health care provider. Make sure you discuss any questions you have with your health care provider. Document Released: 09/25/2012 Document Revised: 01/26/2016 Document Reviewed: 03/12/2014 Elsevier Interactive Patient Education  2018  Else[employer] Inc. Eustachian Tube Dysfunction The eustachian tube connects the middle ear to the back of the nose. It regulates air pressure in the middle ear by allowing air to move between the ear and nose. It also helps to drain fluid from the middle ear space. When the eustachian tube does not function properly, air pressure, fluid, or both can build up in the middle ear. Eustachian tube dysfunction can affect one or both ears. What are the causes? This condition happens when the eustachian tube becomes blocked or cannot open normally. This may result from:  Ear infections.  Colds and other upper respiratory infections.  Allergies.  Irritation, such as from cigarette smoke or acid from the stomach coming up into the esophagus (gastroesophageal reflux).  Sudden changes in air pressure, such as from descending in an airplane.  Abnormal growths in the nose or throat, such as nasal polyps, tumors, or enlarged tissue at the back of the throat (adenoids).  What increases the risk? This condition may be more likely to develop in people who smoke and people who are overweight. Eustachian tube dysfunction may also be more likely to develop in children, especially children who have:  Certain birth defects of the mouth, such as cleft palate.  Large tonsils and adenoids.  What are the signs or symptoms? Symptoms of this condition may include:  A feeling of fullness in the ear.  Ear pain.  Clicking or popping noises in the ear.  Ringing in the ear.  Hearing loss.  Loss of balance.  Symptoms may get  worse when the air pressure around you changes, such as when you travel to an area of high elevation or fly on an airplane. How is this diagnosed? This condition may be diagnosed based on:  Your symptoms.  A physical exam of your ear, nose, and throat.  Tests, such as those that measure: ? The movement of your eardrum (tympanogram). ? Your hearing (audiometry).  How is this  treated? Treatment depends on the cause and severity of your condition. If your symptoms are mild, you may be able to relieve your symptoms by moving air into ("popping") your ears. If you have symptoms of fluid in your ears, treatment may include:  Decongestants.  Antihistamines.  Nasal sprays or ear drops that contain medicines that reduce swelling (steroids).  In some cases, you may need to have a procedure to drain the fluid in your eardrum (myringotomy). In this procedure, a small tube is placed in the eardrum to:  Drain the fluid.  Restore the air in the middle ear space.  Follow these instructions at home:  Take over-the-counter and prescription medicines only as told by your health care provider.  Use techniques to help pop your ears as recommended by your health care provider. These may include: ? Chewing gum. ? Yawning. ? Frequent, forceful swallowing. ? Closing your mouth, holding your nose closed, and gently blowing as if you are trying to blow air out of your nose.  Do not do any of the following until your health care provider approves: ? Travel to high altitudes. ? Fly in airplanes. ? Work in a Estate agent or room. ? Scuba dive.  Keep your ears dry. Dry your ears completely after showering or bathing.  Do not smoke.  Keep all follow-up visits as told by your health care provider. This is important. Contact a health care provider if:  Your symptoms do not go away after treatment.  Your symptoms come back after treatment.  You are unable to pop your ears.  You have: ? A fever. ? Pain in your ear. ? Pain in your head or neck. ? Fluid draining from your ear.  Your hearing suddenly changes.  You become very dizzy.  You lose your balance. This information is not intended to replace advice given to you by your health care provider. Make sure you discuss any questions you have with your health care provider. Document Released: 06/27/2015 Document  Revised: 11/06/2015 Document Reviewed: 06/19/2014 Elsevier Interactive Patient Education  2018 [employer]. Sinusitis, Adult Sinusitis is soreness and inflammation of your sinuses. Sinuses are hollow spaces in the bones around your face. They are located:  Around your eyes.  In the middle of your forehead.  Behind your nose.  In your cheekbones.  Your sinuses and nasal passages are lined with a stringy fluid (mucus). Mucus normally drains out of your sinuses. When your nasal tissues get inflamed or swollen, the mucus can get trapped or blocked so air cannot flow through your sinuses. This lets bacteria, viruses, and funguses grow, and that leads to infection. Follow these instructions at home: Medicines  Take, use, or apply over-the-counter and prescription medicines only as told by your doctor. These may include nasal sprays.  If you were prescribed an antibiotic medicine, take it as told by your doctor. Do not stop taking the antibiotic even if you start to feel better. Hydrate and Humidify  Drink enough water to keep your pee (urine) clear or pale yellow.  Use a cool mist humidifier to  keep the humidity level in your home above 50%.  Breathe in steam for 10-15 minutes, 3-4 times a day or as told by your doctor. You can do this in the bathroom while a hot shower is running.  Try not to spend time in cool or dry air. Rest  Rest as much as possible.  Sleep with your head raised (elevated).  Make sure to get enough sleep each night. General instructions  Put a warm, moist washcloth on your face 3-4 times a day or as told by your doctor. This will help with discomfort.  Wash your hands often with soap and water. If there is no soap and water, use hand sanitizer.  Do not smoke. Avoid being around people who are smoking (secondhand smoke).  Keep all follow-up visits as told by your doctor. This is important. Contact a doctor if:  You have a fever.  Your symptoms get  worse.  Your symptoms do not get better within 10 days. Get help right away if:  You have a very bad headache.  You cannot stop throwing up (vomiting).  You have pain or swelling around your face or eyes.  You have trouble seeing.  You feel confused.  Your neck is stiff.  You have trouble breathing. This information is not intended to replace advice given to you by your health care provider. Make sure you discuss any questions you have with your health care provider. Document Released: 11/17/2007 Document Revised: 01/25/2016 Document Reviewed: 03/26/2015 Elsevier Interactive Patient Education  [employer].

## 2017-07-18 NOTE — Progress Notes (Signed)
   Subjective:    Patient ID: [name], female    DOB: September 10, 1985, 32 y.o.   MRN: [id number]  HPI 32 yo in non acute distress. " I have had sinus issues x one month." Mucus gets caught in my thraot and I cannot get it out. Sore throat in the morning better through out the day.  Only time I can get mucus out is a hot shower, which was green looking " Denies fever or chills, no cough, shortness of breath or chest pain.  " I get sick  5 times a year , I just don't come in here or see a doctor".    Larey SeatFell like she has 5 sinus infections/ year I looked over the last year and she had been into the clinic in April 2018 and  November 2018 for Sinusitis. She does have a history of allergies but takes nothing for them.Did feel she got well after treatment each time.  Review of Systems  Constitutional: Negative for chills and fever.  HENT: Positive for congestion, ear pain (both), postnasal drip, sinus pressure, sinus pain and sore throat (worse in the morning better througout the day). Negative for nosebleeds and rhinorrhea.   Eyes: Negative for discharge and itching.  Respiratory: Negative for cough and shortness of breath.   Cardiovascular: Negative for chest pain.  Gastrointestinal: Negative for abdominal pain.  Genitourinary: Negative for dysuria.  Allergic/Immunologic: Positive for environmental allergies. Negative for food allergies.  Neurological: Positive for headaches. Negative for dizziness, syncope and light-headedness.  Hematological: Positive for adenopathy.  Psychiatric/Behavioral: Negative for behavioral problems, self-injury and suicidal ideas. The patient is not nervous/anxious.        Objective:   Physical Exam  Constitutional: She is oriented to person, place, and time. She appears well-developed and well-nourished.  HENT:  Head: Normocephalic and atraumatic.  Right Ear: Hearing, external ear and ear canal normal. A middle ear effusion is present.  Left Ear: Hearing,  external ear and ear canal normal. A middle ear effusion is present.  Nose: Mucosal edema present.  Mouth/Throat: Uvula is midline, oropharynx is clear and moist and mucous membranes are normal.  Cobblestoning on the posterior phaynx  Eyes: Conjunctivae and EOM are normal. Pupils are equal, round, and reactive to light.  Neck: Neck supple.  Cardiovascular: Normal rate, regular rhythm and normal heart sounds.  Pulmonary/Chest: Effort normal and breath sounds normal.  Lymphadenopathy:    She has cervical adenopathy (left cervical chaiin).  Neurological: She is alert and oriented to person, place, and time.  Skin: Skin is warm and dry.  Psychiatric: She has a normal mood and affect. Her behavior is normal. Judgment and thought content normal.  Nursing note and vitals reviewed.   Cobble stoning noted posterior pharynx.  Left side with erythema and edema.    Assessment & Plan:  Sinusitis Eustachian tube dysfuction Seasonal allergies. OTC Zyrtec and Flonase take daily  as directed. Meds ordered this encounter  Medications  . amoxicillin-clavulanate (AUGMENTIN) 875-125 MG tablet    Sig: Take 1 tablet by mouth 2 (two) times daily.    Dispense:  20 tablet    Refill:  0  Return to the clinic in 3- 5 days if not improving or if any concerns, otherwise  Return to the clinic in one week after finishing the medication for a recheck. 08/04/17. Patient verbalizes understanding and has no questions at discharge.

## 2017-08-04 ENCOUNTER — Encounter: Payer: Self-pay | Admitting: Adult Health

## 2017-08-04 ENCOUNTER — Ambulatory Visit: Payer: Self-pay | Admitting: Adult Health

## 2017-08-04 VITALS — BP 131/69 | HR 98 | Temp 98.0°F | Resp 16 | Ht 63.0 in | Wt 145.0 lb

## 2017-08-04 DIAGNOSIS — J329 Chronic sinusitis, unspecified: Secondary | ICD-10-CM

## 2017-08-04 DIAGNOSIS — J302 Other seasonal allergic rhinitis: Secondary | ICD-10-CM

## 2017-08-04 DIAGNOSIS — Z9109 Other allergy status, other than to drugs and biological substances: Secondary | ICD-10-CM

## 2017-08-04 MED ORDER — PREDNISONE 10 MG (21) PO TBPK: ORAL_TABLET | ORAL | 0 refills | Status: DC

## 2017-08-04 MED ORDER — CETIRIZINE HCL 10 MG PO TABS: 10.0000 mg | ORAL_TABLET | Freq: Every day | ORAL | 3 refills | Status: DC

## 2017-08-04 MED ORDER — AZITHROMYCIN 250 MG PO TABS: ORAL_TABLET | ORAL | 0 refills | Status: DC

## 2017-08-04 NOTE — Progress Notes (Signed)
Subjective:     Patient ID: [name], female   DOB: 12/12/1985, 32 y.o.   MRN: [id number]  HPI  Patient is a 32 year old female in no acute distress who recently finished Augmentin 10 day course last week, she reports she is taking Zyrtec and Flonase daily since last visit. She reports she has less facial pressure.  She reports she continues to have continuous post nasal drip.  She reports her mother and brother have history of sinus issues and previous surgeries. She feels like the older she is getting the worse her sinuses are aggravating her .   She still has mild sinus pressure in her maxillary. Bilateral ear pressure.    Patient  denies any fever, chills, rash, chest pain, shortness of breath, nausea, vomiting, or diarrhea.  She reports she has 4 to 5 sinus infections per year but reports she does not come to the office for all of them just self treats   She requests to see Dr. Suszanne Conners in [place] for ENT as she reports he has seen her daughter and is covered under her insurance  plan.   She denies any other symptoms currently.   Patient Active Problem List   Diagnosis Date Noted  . Routine general medical examination at a health care facility 01/26/2016  . Palpitations 01/18/2013  . Anal fissure 06/02/2011  . Attention deficit disorder 11/24/2006  . ALLERGIC RHINITIS 11/21/2006  . OVARIAN CYST 11/21/2006  . 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 NEPHROLITHIASIS, HX OF 11/21/2006    Current Outpatient Medications:  .  EVEKEO 10 MG TABS, Take 3 tablets by mouth 2 (two) times daily., Disp: , Rfl: 0 .  Levonorgestrel-Ethinyl Estradiol (AMETHIA,CAMRESE) 0.1-0.02 & 0.01 MG tablet, Take 1 tablet by mouth daily., Disp: , Rfl: 4 .  amoxicillin-clavulanate (AUGMENTIN) 875-125 MG tablet, Take 1 tablet by mouth 2 (two) times daily. (Patient not taking: Reported on 08/04/2017), Disp: 20 tablet, Rfl: 0 Review of Systems  Constitutional: Negative for activity change, appetite change, chills,  diaphoresis, fatigue, fever and unexpected weight change.  HENT: Positive for postnasal drip, rhinorrhea, sinus pressure (improved since last visit decribes as mild now. ), sneezing and sore throat (mild ). Negative for congestion and tinnitus.   Respiratory: Negative.   Cardiovascular: Negative.   Gastrointestinal: Negative.   Genitourinary: Negative.   Musculoskeletal: Negative.   Skin: Negative.   Allergic/Immunologic:        -- Fexofenadine-Pseudoephed Er    --  Sudafed  palpatations, felt allegra stopped            working  -- Pseudoephedrine -- Palpitations   --  REACTION: reaction not known   Neurological: Negative.   Hematological: Negative.   Psychiatric/Behavioral: Negative.        Objective:   Physical Exam  Constitutional: She is oriented to person, place, and time. She appears well-developed and well-nourished. No distress.  HENT:  Head: Normocephalic and atraumatic.  Right Ear: External ear and ear canal normal. Tympanic membrane is erythematous (mild ). Tympanic membrane is not perforated. A middle ear effusion is present.  Left Ear: External ear and ear canal normal. Tympanic membrane is not perforated and not erythematous. A middle ear effusion is present.  Nose: Rhinorrhea present. No mucosal edema.  Mouth/Throat: Uvula is midline and mucous membranes are normal. No uvula swelling. Posterior oropharyngeal erythema present. No oropharyngeal exudate, posterior oropharyngeal edema or tonsillar abscesses.  Mild   Eyes: Conjunctivae and EOM are normal. Pupils are equal, round,  and reactive to light. Right eye exhibits no discharge. Left eye exhibits no discharge. No scleral icterus.  Neck: Normal range of motion. Neck supple. No JVD present. No tracheal deviation present.  Cardiovascular: Normal rate, regular rhythm, normal heart sounds and intact distal pulses. Exam reveals no gallop and no friction rub.  No murmur heard. Pulmonary/Chest: Effort normal and breath  sounds normal. No stridor. No respiratory distress. She has no wheezes. She has no rales. She exhibits no tenderness.  Abdominal: Soft. Bowel sounds are normal.  Musculoskeletal: Normal range of motion.  Lymphadenopathy:       Head (right side): No submental, no submandibular, no tonsillar, no preauricular, no posterior auricular and no occipital adenopathy present.       Head (left side): No submental, no submandibular, no tonsillar, no preauricular, no posterior auricular and no occipital adenopathy present.    She has no cervical adenopathy.       Right cervical: No superficial cervical, no deep cervical and no posterior cervical adenopathy present.      Left cervical: No superficial cervical, no deep cervical and no posterior cervical adenopathy present.  Neurological: She is alert and oriented to person, place, and time. She displays normal reflexes. No cranial nerve deficit. She exhibits normal muscle tone. Coordination normal.  Patient moves on and off of exam table and in room without difficulty. Gait is normal in hall and in room. Patient is oriented to person place time and situation. Patient answers questions appropriately and engages in conversation.    Skin: Skin is warm and dry. No rash noted. She is not diaphoretic. No erythema. No pallor.  Psychiatric: She has a normal mood and affect. Her speech is normal and behavior is normal. Judgment and thought content normal. Cognition and memory are normal.  Vitals reviewed.      Assessment:     Recurrent sinus infections - Plan: Ambulatory referral to ENT  Environmental allergies  Seasonal allergic rhinitis, unspecified trigger      Plan:     Orders Placed This Encounter  Procedures  . Ambulatory referral to ENT    Referral Priority:   Routine    Referral Type:   Consultation    Referral Reason:   Specialty Services Required    Referred to Provider:   Newman Pieseoh, Su, MD    Requested Specialty:   Otolaryngology    Number of  Visits Requested:   1   Meds ordered this encounter  Medications  . predniSONE (STERAPRED UNI-PAK 21 TAB) 10 MG (21) TBPK tablet    Sig: By mouth Take 6 tablets on day 1, Take 5 tablets day 2 Take 4 tablets day 3 Take 3 tablets day 4 Take 2 tablets day five 5 Take 1 tablet day    Dispense:  21 tablet    Refill:  0  . cetirizine (ZYRTEC) 10 MG tablet    Sig: Take 1 tablet (10 mg total) by mouth daily.    Dispense:  30 tablet    Refill:  3  . azithromycin (ZITHROMAX) 250 MG tablet    Sig: By mouth Take 2 tablets day 1 (500mg  total) and 1 tablet ( 250 mg ) days 2,3,4,5.    Dispense:  6 tablet    Refill:  0   Continue Zyrtec daily and Flonase daily as  per package instructions and call office if you have not heard from ENT referral within one week call this office and inform.  Will treat with atypical coverage antibiotic  to see if mild sinus pressure improves..   Call office or Tower, Audrie Gallus, MD if no improvement.   Advised to return to clinic for an appointment if no improvement within 72 hours or if any symptoms change or worsen. Advised ER or urgent Care if after hours or on weekend. 911 for emergency symptoms at any time.   Patient verbalized understanding of all instructions given and denies any further questions at this time.      Will refer to ENT and she is aware she may need to follow up with ENT or Tower, Audrie Gallus, MD for allergist referral.

## 2017-08-04 NOTE — Patient Instructions (Signed)
Sinus Rinse What is a sinus rinse? A sinus rinse is a home treatment. It rinses your sinuses with a mixture of salt and water (saline solution). Sinuses are air-filled spaces in your skull behind the bones of your face and forehead. They open into your nasal cavity. To do a sinus rinse, you will need:  Saline solution.  Neti pot or spray bottle. This releases the saline solution into your nose and through your sinuses. You can buy neti pots and spray bottles at: ? [employer]. ? A health food store. ? Online.  When should I do a sinus rinse? A sinus rinse can help to clear your nasal cavity. It can clear:  Mucus.  Dirt.  Dust.  Pollen.  You may do a sinus rinse when you have:  A cold.  A virus.  Allergies.  A sinus infection.  A stuffy nose.  If you are considering a sinus rinse:  Ask your child's doctor before doing a sinus rinse on your child.  Do not do a sinus rinse if you have had: ? Ear or nasal surgery. ? An ear infection. ? Blocked ears.  How do I do a sinus rinse?  Wash your hands.  Disinfect your device using the directions that came with the device.  Dry your device.  Use the solution that comes with your device or one that is sold separately in stores. Follow the mixing directions on the package.  Fill your device with the amount of saline solution as stated in the device instructions.  Stand over a sink and tilt your head sideways over the sink.  Place the spout of the device in your upper nostril (the one closer to the ceiling).  Gently pour or squeeze the saline solution into the nasal cavity. The liquid should drain to the lower nostril if you are not too congested.  Gently blow your nose. Blowing too hard may cause ear pain.  Repeat in the other nostril.  Clean and rinse your device with clean water.  Air-dry your device. Are there risks of a sinus rinse? Sinus rinse is normally very safe and helpful. However, there are a  few risks, which include:  A burning feeling in the sinuses. This may happen if you do not make the saline solution as instructed. Make sure to follow all directions when making the saline solution.  Infection from unclean water. This is rare, but possible.  Nasal irritation.  This information is not intended to replace advice given to you by your health care provider. Make sure you discuss any questions you have with your health care provider. Document Released: 12/26/2013 Document Revised: 04/27/2016 Document Reviewed: 10/16/2013 Elsevier Interactive Patient Education  2017 [employer]. Allergies An allergy is when your body reacts to a substance in a way that is not normal. An allergic reaction can happen after you:  Eat something.  Breathe in something.  Touch something.  You can be allergic to:  Things that are only around during certain seasons, like molds and pollens.  Foods.  Drugs.  Insects.  Animal dander.  What are the signs or symptoms?  Puffiness (swelling). This may happen on the lips, face, tongue, mouth, or throat.  Sneezing.  Coughing.  Breathing loudly (wheezing).  Stuffy nose.  Tingling in the mouth.  A rash.  Itching.  Itchy, red, puffy areas of skin (hives).  Watery eyes.  Throwing up (vomiting).  Watery poop (diarrhea).  Dizziness.  Feeling faint or fainting.  Trouble breathing  or swallowing.  A tight feeling in the chest.  A fast heartbeat. How is this diagnosed? Allergies can be diagnosed with:  A medical and family history.  Skin tests.  Blood tests.  A food diary. A food diary is a record of all the foods, drinks, and symptoms you have each day.  The results of an elimination diet. This diet involves making sure not to eat certain foods and then seeing what happens when you start eating them again.  How is this treated? There is no cure for allergies, but allergic reactions can be treated with medicine.  Severe reactions usually need to be treated at a hospital. How is this prevented? The best way to prevent an allergic reaction is to avoid the thing you are allergic to. Allergy shots and medicines can also help prevent reactions in some cases. This information is not intended to replace advice given to you by your health care provider. Make sure you discuss any questions you have with your health care provider. Document Released: 09/25/2012 Document Revised: 01/26/2016 Document Reviewed: 03/12/2014 Elsevier Interactive Patient Education  [employer]2018 Elsevier Inc.

## 2017-08-16 DIAGNOSIS — F900 Attention-deficit hyperactivity disorder, predominantly inattentive type: Secondary | ICD-10-CM | POA: Diagnosis not present

## 2017-08-24 DIAGNOSIS — J342 Deviated nasal septum: Secondary | ICD-10-CM | POA: Diagnosis not present

## 2017-08-24 DIAGNOSIS — J310 Chronic rhinitis: Secondary | ICD-10-CM | POA: Diagnosis not present

## 2017-08-24 DIAGNOSIS — J343 Hypertrophy of nasal turbinates: Secondary | ICD-10-CM | POA: Diagnosis not present

## 2017-08-26 ENCOUNTER — Other Ambulatory Visit (INDEPENDENT_AMBULATORY_CARE_PROVIDER_SITE_OTHER): Payer: Self-pay | Admitting: Otolaryngology

## 2017-08-26 DIAGNOSIS — J329 Chronic sinusitis, unspecified: Secondary | ICD-10-CM

## 2017-10-07 ENCOUNTER — Encounter: Payer: Self-pay | Admitting: *Deleted

## 2017-10-07 ENCOUNTER — Ambulatory Visit: Payer: BLUE CROSS/BLUE SHIELD | Admitting: Obstetrics and Gynecology

## 2017-10-07 ENCOUNTER — Telehealth: Payer: Self-pay | Admitting: *Deleted

## 2017-10-07 ENCOUNTER — Ambulatory Visit (INDEPENDENT_AMBULATORY_CARE_PROVIDER_SITE_OTHER): Payer: BLUE CROSS/BLUE SHIELD

## 2017-10-07 ENCOUNTER — Encounter: Payer: Self-pay | Admitting: Obstetrics and Gynecology

## 2017-10-07 VITALS — BP 112/78 | HR 88 | Ht 63.0 in | Wt 143.0 lb

## 2017-10-07 DIAGNOSIS — R102 Pelvic and perineal pain: Secondary | ICD-10-CM | POA: Diagnosis not present

## 2017-10-07 DIAGNOSIS — N921 Excessive and frequent menstruation with irregular cycle: Secondary | ICD-10-CM

## 2017-10-07 MED ORDER — MEDROXYPROGESTERONE ACETATE 150 MG/ML IM SUSP: 150.0000 mg | INTRAMUSCULAR | 4 refills | Status: DC

## 2017-10-07 NOTE — Telephone Encounter (Signed)
Pt would like results of her US done today

## 2017-10-07 NOTE — Telephone Encounter (Signed)
It was completely normal

## 2017-10-07 NOTE — Progress Notes (Signed)
Subjective:     Patient ID: [name], female   DOB: May 11, 1986, 32 y.o.   MRN: [id number]  HPI Reports bleeding x 3 weeks, some days spotting and others heavy with clots. Having sharp lower pelvic and vaginal pains. no aggravating or alleviating factors. Was taking OCPs as prescribed until a week ago.  Denies pain with sex or bleeding afterwards.  Pain with tampon insertion.  Review of Systems Negative except stated above in HPI    Objective:   Physical Exam A&Ox4 Well groomed female in no distress Vitals:   10/07/17 1102  Weight: 143 lb (64.9 kg)  Height: 5\' 3"  (1.6 m)  Pelvic exam: normal external genitalia, vulva, vagina, cervix, uterus and adnexa.  Pelvic ultrasound today reveals:  Indications: Pelvic Pain Findings:  The uterus measures 7.0 x 4.5 x 3.2 cm. Echo texture is homogeneous without evidence of focal masses. The Endometrium measures 1.6 mm and contains fluid in the fundal region.  Right Ovary measures 2.0 x 1.6 x 1.1 cm. It is normal in appearance. Left Ovary measures 2.1 x 1.2 x 1.3 cm. It is normal appearance. Survey of the adnexa demonstrates no adnexal masses. There is no free fluid in the cul de sac.    Assessment:     BTB on OCP Pelvic pain     Plan:     Reassured of normal findings. Will consider going back to depo if bleeding persist.  RTC as Needed.  Melody Shambley,CNM

## 2017-10-07 NOTE — Telephone Encounter (Signed)
Sent pt a mychart message. 

## 2017-10-10 ENCOUNTER — Encounter: Payer: Self-pay | Admitting: Obstetrics and Gynecology

## 2017-10-10 ENCOUNTER — Ambulatory Visit (INDEPENDENT_AMBULATORY_CARE_PROVIDER_SITE_OTHER): Payer: BLUE CROSS/BLUE SHIELD | Admitting: Obstetrics and Gynecology

## 2017-10-10 VITALS — BP 112/78 | HR 73 | Wt 143.0 lb

## 2017-10-10 DIAGNOSIS — Z3042 Encounter for surveillance of injectable contraceptive: Secondary | ICD-10-CM

## 2017-10-10 MED ORDER — MEDROXYPROGESTERONE ACETATE 150 MG/ML IM SUSP
150.0000 mg | Freq: Once | INTRAMUSCULAR | Status: AC
  Administered 2017-10-10: 150 mg via INTRAMUSCULAR

## 2017-10-10 NOTE — Progress Notes (Signed)
Date last pap: .na Last Depo-Provera: .1st shot given Side Effects if any: .na Serum HCG indicated? .na Depo-Provera 150 mg IM given by: Rosine Beat, CM Next appointment due .July 15-July 29  BP 112/78   Pulse 73   Wt 143 lb (64.9 kg)   LMP 09/14/2017   BMI 25.33 kg/m

## 2017-12-26 ENCOUNTER — Ambulatory Visit (INDEPENDENT_AMBULATORY_CARE_PROVIDER_SITE_OTHER): Payer: BLUE CROSS/BLUE SHIELD | Admitting: Obstetrics and Gynecology

## 2017-12-26 VITALS — BP 95/66 | HR 71 | Ht 62.0 in | Wt 150.0 lb

## 2017-12-26 DIAGNOSIS — Z3042 Encounter for surveillance of injectable contraceptive: Secondary | ICD-10-CM | POA: Diagnosis not present

## 2017-12-26 MED ORDER — MEDROXYPROGESTERONE ACETATE 150 MG/ML IM SUSP
150.0000 mg | Freq: Once | INTRAMUSCULAR | Status: AC
  Administered 2017-12-26: 150 mg via INTRAMUSCULAR

## 2017-12-26 NOTE — Progress Notes (Signed)
Date last pap: na Last Depo-Provera: 10/10/17 Side Effects if any: na Serum HCG indicated? na Depo-Provera 150 mg IM given by: Rosine BeatAmy Jozalyn Baglio, CMA Next appointment due Sept 30-Oct 14 BP 95/66   Pulse 71   Ht 5\' 2"  (1.575 m)   Wt 150 lb (68 kg)   BMI 27.44 kg/m

## 2018-01-20 ENCOUNTER — Other Ambulatory Visit: Payer: Self-pay | Admitting: Obstetrics and Gynecology

## 2018-01-26 DIAGNOSIS — F900 Attention-deficit hyperactivity disorder, predominantly inattentive type: Secondary | ICD-10-CM | POA: Diagnosis not present

## 2018-02-20 ENCOUNTER — Other Ambulatory Visit: Payer: Self-pay | Admitting: Adult Health

## 2018-02-20 DIAGNOSIS — S93491A Sprain of other ligament of right ankle, initial encounter: Secondary | ICD-10-CM | POA: Diagnosis not present

## 2018-02-21 ENCOUNTER — Encounter: Payer: BLUE CROSS/BLUE SHIELD | Admitting: Obstetrics and Gynecology

## 2018-03-01 ENCOUNTER — Encounter: Payer: Self-pay | Admitting: Obstetrics and Gynecology

## 2018-03-01 ENCOUNTER — Ambulatory Visit (INDEPENDENT_AMBULATORY_CARE_PROVIDER_SITE_OTHER): Payer: BLUE CROSS/BLUE SHIELD | Admitting: Obstetrics and Gynecology

## 2018-03-01 ENCOUNTER — Ambulatory Visit
Admission: RE | Admit: 2018-03-01 | Discharge: 2018-03-01 | Disposition: A | Discharge status: Home / Self Care | Payer: BLUE CROSS/BLUE SHIELD | Source: Ambulatory Visit | Attending: Obstetrics and Gynecology | Admitting: Obstetrics and Gynecology

## 2018-03-01 VITALS — BP 96/67 | HR 94 | Ht 63.0 in | Wt 152.0 lb

## 2018-03-01 DIAGNOSIS — R937 Abnormal findings on diagnostic imaging of other parts of musculoskeletal system: Secondary | ICD-10-CM | POA: Insufficient documentation

## 2018-03-01 DIAGNOSIS — S93401D Sprain of unspecified ligament of right ankle, subsequent encounter: Secondary | ICD-10-CM

## 2018-03-01 DIAGNOSIS — Z23 Encounter for immunization: Secondary | ICD-10-CM | POA: Diagnosis not present

## 2018-03-01 DIAGNOSIS — S93401A Sprain of unspecified ligament of right ankle, initial encounter: Secondary | ICD-10-CM | POA: Diagnosis not present

## 2018-03-01 DIAGNOSIS — Z0000 Encounter for general adult medical examination without abnormal findings: Secondary | ICD-10-CM

## 2018-03-01 DIAGNOSIS — Z01419 Encounter for gynecological examination (general) (routine) without abnormal findings: Secondary | ICD-10-CM

## 2018-03-01 DIAGNOSIS — X58XXXD Exposure to other specified factors, subsequent encounter: Secondary | ICD-10-CM | POA: Insufficient documentation

## 2018-03-01 MED ORDER — FLUOXETINE HCL 20 MG PO CAPS: 20.0000 mg | ORAL_CAPSULE | Freq: Every day | ORAL | 11 refills | Status: DC

## 2018-03-01 MED ORDER — MEDROXYPROGESTERONE ACETATE 150 MG/ML IM SUSP: 150.0000 mg | INTRAMUSCULAR | 4 refills | Status: DC

## 2018-03-01 NOTE — Progress Notes (Signed)
Subjective:   [name] is a 32 y.o. G2P2 Caucasian female here for a routine well-woman exam.  No LMP recorded. (Menstrual status: Other).    Current complaints: sprained (per [employer]) right ankle when getting out of the car 9 days ago. Heard a pop and then immediate swelling- seen at outpatient urgent care and wearing a foot boot, ice in evenings with elevation-states swelling is 50% improved but seeing new bruising and is still painful. PCP: T[facility]y       doesn't desire labs  Social History: Sexual: heterosexual Marital Status: married Living situation: with family Occupation: unknown occupation Tobacco/alcohol: no tobacco use Illicit drugs: no history of illicit drug use  The following portions of the patient's history were reviewed and updated as appropriate: allergies, current medications, past family history, past medical history, past social history, past surgical history and problem list.  Past Medical History Past Medical History:  Diagnosis Date  . ADD (attention deficit disorder)   . Allergic rhinitis   . Endometriosis   . Headache(784.0)   . Other acne   . Other and unspecified ovarian cyst   . Personal history of urinary calculi     Past Surgical History Past Surgical History:  Procedure Laterality Date  . APPENDECTOMY  3/07  . DILATION AND CURETTAGE OF UTERUS  4/10   miscarriage  . LAPAROSCOPY  2006   pelvic--endometriosis  . MOLE REMOVAL     ? atypical    Gynecologic History G2P2  No LMP recorded. (Menstrual status: Other). Contraception: Depo-Provera injections Last Pap: 2018. Results were: normal  Obstetric History OB History  Gravida Para Term Preterm AB Living  2 2       2   SAB TAB Ectopic Multiple Live Births          2    # Outcome Date GA Lbr Len/2nd Weight Sex Delivery Anes PTL Lv  2 Para     F Vag-Spont   LIV  1 Para     M Vag-Spont   LIV    Current Medications Current Outpatient Medications on File Prior to Visit  Medication Sig  Dispense Refill  . cetirizine (ZYRTEC) 10 MG tablet TAKE 1 TABLET BY MOUTH EVERY DAY 30 tablet 3  . EVEKEO 10 MG TABS Take 3 tablets by mouth 2 (two) times daily.  0  . FLUoxetine (PROZAC) 10 MG capsule TAKE 1 CAPSULE BY MOUTH EVERY DAY 30 capsule 3  . medroxyPROGESTERone (DEPO-PROVERA) 150 MG/ML injection Inject 1 mL (150 mg total) into the muscle every 3 (three) months. 1 mL 4  . predniSONE (STERAPRED UNI-PAK 21 TAB) 10 MG (21) TBPK tablet By mouth Take 6 tablets on day 1, Take 5 tablets day 2 Take 4 tablets day 3 Take 3 tablets day 4 Take 2 tablets day five 5 Take 1 tablet day (Patient not taking: Reported on 10/07/2017) 21 tablet 0   No current facility-administered medications on file prior to visit.     Review of Systems Patient denies any headaches, blurred vision, shortness of breath, chest pain, abdominal pain, problems with bowel movements, urination, or intercourse.  Objective:  BP 96/67   Pulse 94   Ht 5\' 3"  (1.6 m)   Wt 152 lb (68.9 kg)   BMI 26.93 kg/m  Physical Exam  General:  Well developed, well nourished, no acute distress. She is alert and oriented x3. Skin:  Warm and dry Neck:  Midline trachea, no thyromegaly or nodules Cardiovascular: Regular rate and rhythm, no murmur  heard Lungs:  Effort normal, all lung fields clear to auscultation bilaterally Breasts:  No dominant palpable mass, retraction, or nipple discharge Abdomen:  Soft, non tender, no hepatosplenomegaly or masses Pelvic:  External genitalia is normal in appearance.  The vagina is normal in appearance. The cervix is bulbous, no CMT.  Thin prep pap is not done . Uterus is felt to be normal size, shape, and contour.  No adnexal masses or tenderness noted. Extremities:  No swelling or varicosities noted except in right outer ankle, new bruising noted. Psych:  She has a normal mood and affect  Assessment:   Healthy well-woman exam Secondary amenorrhea Right ankle sprain Flu Vaccine needed  Plan:  Flu  vaccine given Ankle x-rays ordered will follow up accordingly (prefers [facility] if possible) F/U 1 year for AE, or sooner if needed   Faylynn Stamos Suzan NailerN Jestine Bicknell, CNM

## 2018-03-01 NOTE — Progress Notes (Signed)
Pt presents today for annual exam. Pt states she has had some spotting over the last couple of weeks. Otherwise, no concerns. Pt will receive flu vaccine today.

## 2018-03-02 ENCOUNTER — Other Ambulatory Visit: Payer: Self-pay | Admitting: Obstetrics and Gynecology

## 2018-03-02 DIAGNOSIS — S93401D Sprain of unspecified ligament of right ankle, subsequent encounter: Secondary | ICD-10-CM

## 2018-03-07 ENCOUNTER — Encounter (INDEPENDENT_AMBULATORY_CARE_PROVIDER_SITE_OTHER): Payer: Self-pay | Admitting: Orthopaedic Surgery

## 2018-03-07 ENCOUNTER — Ambulatory Visit (INDEPENDENT_AMBULATORY_CARE_PROVIDER_SITE_OTHER): Payer: BLUE CROSS/BLUE SHIELD | Admitting: Orthopaedic Surgery

## 2018-03-07 DIAGNOSIS — S93431A Sprain of tibiofibular ligament of right ankle, initial encounter: Secondary | ICD-10-CM

## 2018-03-07 MED ORDER — HYDROCODONE-ACETAMINOPHEN 5-325 MG PO TABS
1.0000 | ORAL_TABLET | Freq: Four times a day (QID) | ORAL | 0 refills | Status: DC | PRN
Start: 2018-03-07 — End: 2018-11-20

## 2018-03-07 NOTE — Progress Notes (Signed)
Office Visit Note   Patient: [name]           Date of Birth: [birth year]           MRN: [id number] Visit Date: 03/07/2018              Requested by: Purcell Nails, CNM [address] [place], [place] [address] PCP: Judy Pimple, MD   Assessment & Plan: Visit Diagnoses:  1. Sprain of tibiofibular ligament of right ankle, initial encounter     Plan: I gave her reassurance that her ankle should do well with time.  She can weight-bear as tolerated in her cam walking boot and can transition to Kristy ASO when she is comfortable wearing regular tennis shoe.  I will send in some pain medication for her.  She understands that she can still use her crutch and back off on weightbearing if it is uncomfortable to her but she can also increase her weightbearing as comfort allows with no contact sports.  I would like to see her back in 3 weeks with a repeat 3 views of her right ankle.  All question concerns were answered and addressed.  Follow-Up Instructions: Return in about 3 weeks (around 03/28/2018).   Orders:  No orders of the defined types were placed in this encounter.  Meds ordered this encounter  Medications  . HYDROcodone-acetaminophen (NORCO/VICODIN) 5-325 MG tablet    Sig: Take 1-2 tablets by mouth every 6 (six) hours as needed for moderate pain.    Dispense:  30 tablet    Refill:  0      Procedures: No procedures performed   Clinical Data: No additional findings.   Subjective: Chief Complaint  Patient presents with  . Right Ankle - Pain  Patient is a very pleasant 31 year old female with no previous ankle injuries who actually injured her right ankle stepping awkwardly out of her car when she rolled her ankle and felt a significant pop.  This happened 2 weeks ago.  She is in a cam walking boot and having significant ankle pain and swelling.  It is swollen today and she said this is actually less than it had been over the last few weeks.  She reports  some numbness and tingling in her foot as well.  Her biggest pain occurs at night when she sleeping.  She does have crutches with her as well.  HPI  Review of Systems She currently denies any headache, chest pain, shortness of breath, fever, chills, nausea, vomiting.  Objective: Vital Signs: There were no vitals taken for this visit.  Physical Exam She is alert and oriented x3 and in no acute distress Ortho Exam Examination of her right ankle does show global swelling.  Most of her tenderness is over the anterior talofibular ligament area of her ankle.  She is neurovascularly intact.  Passively I can put her through full range of motion.  The ankle feels ligamentously stable. Specialty Comments:  No specialty comments available.  Imaging: No results found. 3 views of the right ankle independently reviewed show well located ankle and ankle mortise is intact.  There is no fracture of the fibula or lateral malleolus.  On the lateral view in the anterior soft tissue of the ankle there is a small cortical irregularity in the soft tissue or potentially joint which may be from the talus.  This is a small area that is likely from Kristy avulsion of 1 of the ligaments.  PMFS  History: Patient Active Problem List   Diagnosis Date Noted  . Routine general medical examination at a health care facility 01/26/2016  . Palpitations 01/18/2013  . Anal fissure 06/02/2011  . Attention deficit disorder 11/24/2006  . ALLERGIC RHINITIS 11/21/2006  . OVARIAN CYST 11/21/2006  . 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 NEPHROLITHIASIS, HX OF 11/21/2006   Past Medical History:  Diagnosis Date  . ADD (attention deficit disorder)   . Allergic rhinitis   . Endometriosis   . Headache(784.0)   . Other acne   . Other and unspecified ovarian cyst   . Personal history of urinary calculi     Family History  Problem Relation Age of Onset  . Colon polyps Father   . Other Father        Brain tumor  . Depression Mother       Past Surgical History:  Procedure Laterality Date  . APPENDECTOMY  3/07  . DILATION AND CURETTAGE OF UTERUS  4/10   miscarriage  . LAPAROSCOPY  2006   pelvic--endometriosis  . MOLE REMOVAL     ? atypical   Social History   Occupational History  . Occupation: Nurse, [employer]  Tobacco Use  . Smoking status: Never Smoker  . Smokeless tobacco: Never Used  Substance and Sexual Activity  . Alcohol use: Yes    Alcohol/week: 0.0 standard drinks    Comment: rare  . Drug use: No  . Sexual activity: Yes    Birth control/protection: None, Injection

## 2018-03-13 ENCOUNTER — Ambulatory Visit (INDEPENDENT_AMBULATORY_CARE_PROVIDER_SITE_OTHER): Payer: BLUE CROSS/BLUE SHIELD | Admitting: Obstetrics and Gynecology

## 2018-03-13 VITALS — BP 105/68 | HR 73 | Ht 63.0 in | Wt 154.8 lb

## 2018-03-13 DIAGNOSIS — Z3042 Encounter for surveillance of injectable contraceptive: Secondary | ICD-10-CM

## 2018-03-13 MED ORDER — MEDROXYPROGESTERONE ACETATE 150 MG/ML IM SUSP
150.0000 mg | Freq: Once | INTRAMUSCULAR | Status: AC
  Administered 2018-03-13: 150 mg via INTRAMUSCULAR

## 2018-03-13 NOTE — Progress Notes (Signed)
Date last pap: 02/17/17 . Last Depo-Provera: 12/26/17. Side Effects if any: none. Serum HCG indicated? n/a. Depo-Provera 150 mg IM given by: FH. Next appointment due Dec 16-Dec31.  BP 105/68   Pulse 73   Ht 5\' 3"  (1.6 m)   Wt 154 lb 12.8 oz (70.2 kg)   BMI 27.42 kg/m

## 2018-03-28 ENCOUNTER — Ambulatory Visit (INDEPENDENT_AMBULATORY_CARE_PROVIDER_SITE_OTHER): Payer: Self-pay

## 2018-03-28 ENCOUNTER — Ambulatory Visit (INDEPENDENT_AMBULATORY_CARE_PROVIDER_SITE_OTHER): Payer: BLUE CROSS/BLUE SHIELD | Admitting: Orthopaedic Surgery

## 2018-03-28 ENCOUNTER — Encounter (INDEPENDENT_AMBULATORY_CARE_PROVIDER_SITE_OTHER): Payer: Self-pay | Admitting: Orthopaedic Surgery

## 2018-03-28 VITALS — Ht 63.0 in | Wt 154.8 lb

## 2018-03-28 DIAGNOSIS — S93431D Sprain of tibiofibular ligament of right ankle, subsequent encounter: Secondary | ICD-10-CM | POA: Diagnosis not present

## 2018-03-28 NOTE — Progress Notes (Signed)
Office Visit Note   Patient: [name]           Date of Birth: 21-Aug-1985           MRN: [id number] Visit Date: 03/28/2018              Requested by: Tower, Audrie Gallus, MD [address] [place], [place] [address] PCP: Judy Pimple, MD   Assessment & Plan: Visit Diagnoses:  1. Sprain of tibiofibular ligament of right ankle, subsequent encounter     Plan: We will send her to physical therapy to work on range of motion strengthening of the ankle.  They will wean her out of the ASO as proprioception strength improved.  If she develops any mechanical symptoms which are reviewed with the patient in an MRI to rule out osteochondral lesion talus.  Follow-Up Instructions: Return in about 4 weeks (around 04/25/2018).   Orders:  Orders Placed This Encounter  Procedures  . XR Ankle Complete Right   No orders of the defined types were placed in this encounter.     Procedures: No procedures performed   Clinical Data: No additional findings.   Subjective: Chief Complaint  Patient presents with  . Right Ankle - Pain, Follow-up    HPI [name] returns today follow-up of her right ankle pain.  She reports that her swelling has gone down.  She did start wearing the ASO brace as of last week.  She continues to have pain with plantarflexion ankle and inversion eversion of the ankle.  She is almost 5 weeks status post injury. Review of Systems Please see HPI otherwise negative  Objective: Vital Signs: Ht 5\' 3"  (1.6 m)   Wt 154 lb 12.8 oz (70.2 kg)   BMI 27.42 kg/m   Physical Exam  Constitutional: She appears well-developed and well-nourished. No distress.  Cardiovascular: Intact distal pulses.  Skin: She is not diaphoretic.    Ortho Exam Right foot and ankle no rashes skin lesions ulcerations.  Small amount of ecchymosis to dorsal aspect of the foot.  She has full dorsiflexion without pain.  Discomfort with plantar flexion lacking degrees.  She has 5 out of 5  strength with inversion eversion against resistance.  Tenderness over the lateral malleolus and over the anterior talofibular ligament.  Minimal tenderness over the base of the third fourth and fifth metatarsals.  Calf supple.  No tenderness over the proximal tib-fib. Specialty Comments:  No specialty comments available.  Imaging: Xr Ankle Complete Right  Result Date: 03/28/2018 3 views right ankle: Talus well located within the ankle mortise.  No diastases.  Small calcific avulsion most likely off of the talus is again seen.  Lateral talus there is an area of increased density that could represent a osteochondral lesion mass seen on the oblique view.  Basis of the third fourth and fifth metatarsals are seen on the oblique view shows no obvious signs of fracture or dislocation.  No other fractures bony abnormalities seen.    PMFS History: Patient Active Problem List   Diagnosis Date Noted  . Routine general medical examination at a health care facility 01/26/2016  . Palpitations 01/18/2013  . Anal fissure 06/02/2011  . Attention deficit disorder 11/24/2006  . ALLERGIC RHINITIS 11/21/2006  . OVARIAN CYST 11/21/2006  . 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 NEPHROLITHIASIS, HX OF 11/21/2006   Past Medical History:  Diagnosis Date  . ADD (attention deficit disorder)   . Allergic rhinitis   . Endometriosis   .  Headache(784.0)   . Other acne   . Other and unspecified ovarian cyst   . Personal history of urinary calculi     Family History  Problem Relation Age of Onset  . Colon polyps Father   . Other Father        Brain tumor  . Depression Mother     Past Surgical History:  Procedure Laterality Date  . APPENDECTOMY  3/07  . DILATION AND CURETTAGE OF UTERUS  4/10   miscarriage  . LAPAROSCOPY  2006   pelvic--endometriosis  . MOLE REMOVAL     ? atypical   Social History   Occupational History  . Occupation: Nurse, learning disability  Tobacco Use  . Smoking status: Never Smoker  .  Smokeless tobacco: Never Used  Substance and Sexual Activity  . Alcohol use: Yes    Alcohol/week: 0.0 standard drinks    Comment: rare  . Drug use: No  . Sexual activity: Yes    Birth control/protection: None, Injection

## 2018-04-25 ENCOUNTER — Ambulatory Visit (INDEPENDENT_AMBULATORY_CARE_PROVIDER_SITE_OTHER): Payer: BLUE CROSS/BLUE SHIELD | Admitting: Orthopaedic Surgery

## 2018-05-29 ENCOUNTER — Ambulatory Visit (INDEPENDENT_AMBULATORY_CARE_PROVIDER_SITE_OTHER): Payer: BLUE CROSS/BLUE SHIELD | Admitting: Obstetrics and Gynecology

## 2018-05-29 VITALS — BP 112/78 | Ht 63.0 in | Wt 162.3 lb

## 2018-05-29 DIAGNOSIS — Z3042 Encounter for surveillance of injectable contraceptive: Secondary | ICD-10-CM

## 2018-05-29 MED ORDER — MEDROXYPROGESTERONE ACETATE 150 MG/ML IM SUSP
150.0000 mg | Freq: Once | INTRAMUSCULAR | Status: AC
  Administered 2018-05-29: 150 mg via INTRAMUSCULAR

## 2018-05-29 NOTE — Progress Notes (Signed)
Date last pap: NA. Last Depo-Provera:03/13/18 Side Effects if any:NA Serum HCG indicated? no. Depo-Provera 150 mg IM given by: Britt BottomJamie Wheeley  Next appointment due 08/15/2018-08/28/17   Vitals:   05/29/18 0820  BP: 112/78

## 2018-07-25 ENCOUNTER — Other Ambulatory Visit: Payer: Self-pay | Admitting: Medical

## 2018-09-24 ENCOUNTER — Other Ambulatory Visit: Payer: Self-pay | Admitting: Medical

## 2018-11-20 ENCOUNTER — Other Ambulatory Visit: Payer: Self-pay

## 2018-11-20 ENCOUNTER — Encounter: Payer: Self-pay | Admitting: Family Medicine

## 2018-11-20 ENCOUNTER — Ambulatory Visit (INDEPENDENT_AMBULATORY_CARE_PROVIDER_SITE_OTHER): Payer: BC Managed Care – PPO | Admitting: Family Medicine

## 2018-11-20 VITALS — Temp 98.8°F | Wt 162.0 lb

## 2018-11-20 DIAGNOSIS — W57XXXA Bitten or stung by nonvenomous insect and other nonvenomous arthropods, initial encounter: Secondary | ICD-10-CM

## 2018-11-20 DIAGNOSIS — S70361A Insect bite (nonvenomous), right thigh, initial encounter: Secondary | ICD-10-CM

## 2018-11-20 DIAGNOSIS — R509 Fever, unspecified: Secondary | ICD-10-CM

## 2018-11-20 NOTE — Progress Notes (Signed)
I connected with [name] on 11/20/18 at  3:20 PM EDT by video and verified that I am speaking with the correct person using two identifiers.   I discussed the limitations, risks, security and privacy concerns of performing an evaluation and management service by video and the availability of in person appointments. I also discussed with the patient that there may be a patient responsible charge related to this service. The patient expressed understanding and agreed to proceed.  Patient location: Home Provider Location: [facility] Participants: Lesleigh Noe and EDOM SCHMUHL   Subjective:     [name] is a 33 y.o. female presenting for Insect Bite (Pulled it off on 11/19/2018 on right upper thigh. Itching. Huge red spot-like bulls eye. )     HPI   #Tick Bite - removed on 11/19/2018 - right upper thigh - was itchy initially - now with a bullseye like rash present - no other lesions - Put markings around due to hx of severe spider bite - was not present when she went to bed and removed in the morning - was also still very small and very easy to remove - did not save it because did not appear attached and was small  - red area is warm to the touch    Review of Systems  Constitutional: Negative for chills and fever.  Gastrointestinal: Negative for nausea and vomiting.  Musculoskeletal: Negative for arthralgias and myalgias.     Social History   Tobacco Use  Smoking Status Never Smoker  Smokeless Tobacco Never Used        Objective:   BP Readings from Last 3 Encounters:  05/29/18 112/78  03/13/18 105/68  03/01/18 96/67   Wt Readings from Last 3 Encounters:  11/20/18 162 lb (73.5 kg)  05/29/18 162 lb 4.8 oz (73.6 kg)  03/28/18 154 lb 12.8 oz (70.2 kg)   Temp 98.8 F (37.1 C) Comment: per patient.  Wt 162 lb (73.5 kg) Comment: per patient  LMP  (Within Years) Comment: quit Depo injections-last one was 05/2018  BMI 28.70 kg/m     Physical Exam Constitutional:      Appearance: Normal appearance. She is not ill-appearing.  HENT:     Head: Normocephalic and atraumatic.     Right Ear: External ear normal.     Left Ear: External ear normal.  Eyes:     Conjunctiva/sclera: Conjunctivae normal.  Pulmonary:     Effort: Pulmonary effort is normal. No respiratory distress.  Skin:    Comments: Difficult to see on video but reviewed MyChart image. Thigh with bit mark with mild erythema and swelling surrounding the area. Faint partial clear, but no definitive bullseye pattern consistent with erythema migrans   Neurological:     Mental Status: She is alert. Mental status is at baseline.  Psychiatric:        Mood and Affect: Mood normal.        Behavior: Behavior normal.        Thought Content: Thought content normal.        Judgment: Judgment normal.             Assessment & Plan:   Problem List Items Addressed This Visit    None    Visit Diagnoses    Tick bite of right thigh with local reaction, initial encounter    -  Primary     Too early for erythema migrans and photo reassuring that more  consistent with local reaction. Steroid cream. Continue to monitor for symptoms.   Return if symptoms worsen or fail to improve.  Lynnda ChildJessica R Cody, MD

## 2018-11-21 ENCOUNTER — Other Ambulatory Visit (INDEPENDENT_AMBULATORY_CARE_PROVIDER_SITE_OTHER): Payer: BC Managed Care – PPO

## 2018-11-21 DIAGNOSIS — R509 Fever, unspecified: Secondary | ICD-10-CM

## 2018-11-21 DIAGNOSIS — W57XXXA Bitten or stung by nonvenomous insect and other nonvenomous arthropods, initial encounter: Secondary | ICD-10-CM

## 2018-11-21 DIAGNOSIS — S70361A Insect bite (nonvenomous), right thigh, initial encounter: Secondary | ICD-10-CM | POA: Diagnosis not present

## 2018-11-21 MED ORDER — DOXYCYCLINE HYCLATE 100 MG PO TABS: 100.0000 mg | ORAL_TABLET | Freq: Two times a day (BID) | ORAL | 0 refills | Status: AC

## 2018-11-21 NOTE — Addendum Note (Signed)
Addended by: Waunita Schooner R on: 11/21/2018 10:46 AM   Modules accepted: Orders

## 2018-11-21 NOTE — Telephone Encounter (Signed)
Called to discuss symptoms with patient.   Advised considering starting antibiotics and testing for Lyme disease as symptoms unclear at this time.   If Lyme disease test is negative or symptoms not improving on antibiotics may need to consider alternative diagnosis of Covid-19 given current pandemic.   Instructed pt to call to schedule lab appointment.   Doxycycline sent to pharmacy

## 2018-11-22 LAB — LYME AB/WESTERN BLOT REFLEX
LYME DISEASE AB, QUANT, IGM: <0.8 index (ref 0.00–0.79)
Lyme IgG/IgM Ab: <0.91 {ISR} (ref 0.00–0.90)

## 2018-11-23 ENCOUNTER — Telehealth: Payer: Self-pay | Admitting: General Practice

## 2018-11-23 ENCOUNTER — Telehealth: Payer: Self-pay | Admitting: Family Medicine

## 2018-11-23 NOTE — Telephone Encounter (Signed)
Pt on doxycycline   Still running a low grade fever The tick bite is still swollen and puffy and spreading No new rashes Was having muscle and joint pain   Has been doing hydrocortisone cream on the tick bite   Let pt know that lyme disease testing was negative. Discussed that she could have another tick borne illness but her symptom onset was a little early and she does not have the typical rash which would be expected.   Discussed that she could have Covid-19 and advised testing to hopefully avoid long course of antibiotic.   She was hesitant to be tested, discussed placing referral and she could schedule for Monday if symptoms not improving on antibiotics.   Advised calling back or sending Mychart if tick bite lesion worsens or appears infected or she develops new symptoms that would help with diagnosis (Rash or loss of sense of taste/smell and SOB).   [name]

## 2018-11-23 NOTE — Telephone Encounter (Signed)
Called pt at [facility] to schedule Covid-19  testing. Pt says that per her and provider's conversation, she would like to wait on having testing.    Pt declined scheduling at this time.

## 2018-11-23 NOTE — Telephone Encounter (Signed)
-----   Message from Lesleigh Noe, MD sent at 11/23/2018  9:48 AM EDT ----- Regarding: testing Please consider testing for coronavirus.   Pt with fever and muscle aches/joint pain. However with recent tick bite exposure. Started on abx but negative lyme disease work-up and lacking classic symptoms and onset for tick borne illness. Hoping to rule-out coronavirus as cause of fever/muscle aches to avoid 2 week course of abx.   Thanks

## 2019-03-07 ENCOUNTER — Ambulatory Visit (INDEPENDENT_AMBULATORY_CARE_PROVIDER_SITE_OTHER): Payer: BC Managed Care – PPO | Admitting: Obstetrics and Gynecology

## 2019-03-07 ENCOUNTER — Encounter: Payer: Self-pay | Admitting: Obstetrics and Gynecology

## 2019-03-07 ENCOUNTER — Other Ambulatory Visit: Payer: Self-pay

## 2019-03-07 ENCOUNTER — Other Ambulatory Visit (HOSPITAL_COMMUNITY)
Admission: RE | Admit: 2019-03-07 | Discharge: 2019-03-07 | Disposition: A | Discharge status: Home / Self Care | Payer: BC Managed Care – PPO | Source: Ambulatory Visit | Attending: Obstetrics and Gynecology | Admitting: Obstetrics and Gynecology

## 2019-03-07 VITALS — BP 105/78 | HR 85 | Ht 63.0 in | Wt 160.5 lb

## 2019-03-07 DIAGNOSIS — Z309 Encounter for contraceptive management, unspecified: Secondary | ICD-10-CM | POA: Diagnosis not present

## 2019-03-07 DIAGNOSIS — Z01419 Encounter for gynecological examination (general) (routine) without abnormal findings: Secondary | ICD-10-CM

## 2019-03-07 DIAGNOSIS — Z6828 Body mass index (BMI) 28.0-28.9, adult: Secondary | ICD-10-CM

## 2019-03-07 MED ORDER — FLUOXETINE HCL 10 MG PO CAPS: 10.0000 mg | ORAL_CAPSULE | Freq: Every day | ORAL | 3 refills | Status: DC

## 2019-03-07 MED ORDER — LEVONORGEST-ETH ESTRAD 91-DAY 0.1-0.02 & 0.01 MG PO TABS: 1.0000 | ORAL_TABLET | Freq: Every day | ORAL | 4 refills | Status: DC

## 2019-03-07 MED ORDER — FLUOXETINE HCL 20 MG PO TABS: 20.0000 mg | ORAL_TABLET | Freq: Every day | ORAL | 4 refills | Status: DC

## 2019-03-07 NOTE — Patient Instructions (Signed)
 Preventive Care 21-33 Years Old, Female Preventive care refers to visits with your health care provider and lifestyle choices that can promote health and wellness. This includes:  A yearly physical exam. This may also be called an annual well check.  Regular dental visits and eye exams.  Immunizations.  Screening for certain conditions.  Healthy lifestyle choices, such as eating a healthy diet, getting regular exercise, not using drugs or products that contain nicotine and tobacco, and limiting alcohol use. What can I expect for my preventive care visit? Physical exam Your health care provider will check your:  Height and weight. This may be used to calculate body mass index (BMI), which tells if you are at a healthy weight.  Heart rate and blood pressure.  Skin for abnormal spots. Counseling Your health care provider may ask you questions about your:  Alcohol, tobacco, and drug use.  Emotional well-being.  Home and relationship well-being.  Sexual activity.  Eating habits.  Work and work environment.  Method of birth control.  Menstrual cycle.  Pregnancy history. What immunizations do I need?  Influenza (flu) vaccine  This is recommended every year. Tetanus, diphtheria, and pertussis (Tdap) vaccine  You may need a Td booster every 10 years. Varicella (chickenpox) vaccine  You may need this if you have not been vaccinated. Human papillomavirus (HPV) vaccine  If recommended by your health care provider, you may need three doses over 6 months. Measles, mumps, and rubella (MMR) vaccine  You may need at least one dose of MMR. You may also need a second dose. Meningococcal conjugate (MenACWY) vaccine  One dose is recommended if you are age 19-21 years and a first-year college student living in a residence hall, or if you have one of several medical conditions. You may also need additional booster doses. Pneumococcal conjugate (PCV13) vaccine  You may need  this if you have certain conditions and were not previously vaccinated. Pneumococcal polysaccharide (PPSV23) vaccine  You may need one or two doses if you smoke cigarettes or if you have certain conditions. Hepatitis A vaccine  You may need this if you have certain conditions or if you travel or work in places where you may be exposed to hepatitis A. Hepatitis B vaccine  You may need this if you have certain conditions or if you travel or work in places where you may be exposed to hepatitis B. Haemophilus influenzae type b (Hib) vaccine  You may need this if you have certain conditions. You may receive vaccines as individual doses or as more than one vaccine together in one shot (combination vaccines). Talk with your health care provider about the risks and benefits of combination vaccines. What tests do I need?  Blood tests  Lipid and cholesterol levels. These may be checked every 5 years starting at age 20.  Hepatitis C test.  Hepatitis B test. Screening  Diabetes screening. This is done by checking your blood sugar (glucose) after you have not eaten for a while (fasting).  Sexually transmitted disease (STD) testing.  BRCA-related cancer screening. This may be done if you have a family history of breast, ovarian, tubal, or peritoneal cancers.  Pelvic exam and Pap test. This may be done every 3 years starting at age 21. Starting at age 30, this may be done every 5 years if you have a Pap test in combination with an HPV test. Talk with your health care provider about your test results, treatment options, and if necessary, the need for more   tests. Follow these instructions at home: Eating and drinking   Eat a diet that includes fresh fruits and vegetables, whole grains, lean protein, and low-fat dairy.  Take vitamin and mineral supplements as recommended by your health care provider.  Do not drink alcohol if: ? Your health care provider tells you not to drink. ? You are  pregnant, may be pregnant, or are planning to become pregnant.  If you drink alcohol: ? Limit how much you have to 0-1 drink a day. ? Be aware of how much alcohol is in your drink. In the [place] one drink equals one 12 oz bottle of beer (355 mL), one 5 oz glass of wine (148 mL), or one 1 oz glass of hard liquor (44 mL). Lifestyle  Take daily care of your teeth and gums.  Stay active. Exercise for at least 30 minutes on 5 or more days each week.  Do not use any products that contain nicotine or tobacco, such as cigarettes, e-cigarettes, and chewing tobacco. If you need help quitting, ask your health care provider.  If you are sexually active, practice safe sex. Use a condom or other form of birth control (contraception) in order to prevent pregnancy and STIs (sexually transmitted infections). If you plan to become pregnant, see your health care provider for a preconception visit. What's next?  Visit your health care provider once a year for a well check visit.  Ask your health care provider how often you should have your eyes and teeth checked.  Stay up to date on all vaccines. This information is not intended to replace advice given to you by your health care provider. Make sure you discuss any questions you have with your health care provider. Document Released: 07/27/2001 Document Revised: 02/09/2018 Document Reviewed: 02/09/2018 Elsevier Patient Education  2020 [employer].

## 2019-03-07 NOTE — Progress Notes (Signed)
Subjective:   [name] is a 33 y.o. G2P2 Caucasian female here for a routine well-woman exam.  Patient's last menstrual period was 02/14/2019.    Current complaints: increased anxiety with covid and working from home, also desires starting BCP until spouse has vasectomy.  PCP: tower       does desire labs  Social History: Sexual: heterosexual Marital Status: married Living situation: with family Occupation: unknown occupation Tobacco/alcohol: no tobacco use Illicit drugs: no history of illicit drug use  The following portions of the patient's history were reviewed and updated as appropriate: allergies, current medications, past family history, past medical history, past social history, past surgical history and problem list.  Past Medical History Past Medical History:  Diagnosis Date  . ADD (attention deficit disorder)   . Allergic rhinitis   . Endometriosis   . Headache(784.0)   . Other acne   . Other and unspecified ovarian cyst   . Personal history of urinary calculi     Past Surgical History Past Surgical History:  Procedure Laterality Date  . APPENDECTOMY  3/07  . DILATION AND CURETTAGE OF UTERUS  4/10   miscarriage  . LAPAROSCOPY  2006   pelvic--endometriosis  . MOLE REMOVAL     ? atypical    Gynecologic History G2P2  Patient's last menstrual period was 02/14/2019. Contraception: condoms Last Pap: 2018. Results were: normal Last mammogram: NA Obstetric History OB History  Gravida Para Term Preterm AB Living  2 2       2   SAB TAB Ectopic Multiple Live Births          2    # Outcome Date GA Lbr Len/2nd Weight Sex Delivery Anes PTL Lv  2 Para     F Vag-Spont   LIV  1 Para     M Vag-Spont   LIV    Current Medications Current Outpatient Medications on File Prior to Visit  Medication Sig Dispense Refill  . cetirizine (ZYRTEC) 10 MG tablet TAKE 1 TABLET BY MOUTH EVERY DAY 30 tablet 3  . EVEKEO 10 MG TABS Take 3 tablets by mouth 2 (two) times daily.   0  . FLUoxetine (PROZAC) 20 MG tablet Take 20 mg by mouth daily.     No current facility-administered medications on file prior to visit.     Review of Systems Patient denies any headaches, blurred vision, shortness of breath, chest pain, abdominal pain, problems with bowel movements, urination, or intercourse.  Objective:  BP 105/78   Pulse 85   Ht 5\' 3"  (1.6 m)   Wt 160 lb 8 oz (72.8 kg)   LMP 02/14/2019   BMI 28.43 kg/m  Physical Exam  General:  Well developed, well nourished, no acute distress. She is alert and oriented x3. Skin:  Warm and dry Neck:  Midline trachea, no thyromegaly or nodules Cardiovascular: Regular rate and rhythm, no murmur heard Lungs:  Effort normal, all lung fields clear to auscultation bilaterally Breasts:  No dominant palpable mass, retraction, or nipple discharge Abdomen:  Soft, non tender, no hepatosplenomegaly or masses Pelvic:  External genitalia is normal in appearance.  The vagina is normal in appearance. The cervix is bulbous, no CMT.  Thin prep pap is done with HR HPV cotesting. Uterus is felt to be normal size, shape, and contour.  No adnexal masses or tenderness noted.  Extremities:  No swelling or varicosities noted Psych:  She has a normal mood and affect  Assessment:   Healthy well-woman exam  BMI 28 Contraception management anxiety   Plan:  Will increase prozac to 30mg  daily Add loseasonique- to start this Sunday. Labs obtained-will follow up accordingly Declines flu vaccine F/U 1 year for AE, or sooner if needed   Melody Rockney Ghee, CNM

## 2019-03-08 LAB — COMPREHENSIVE METABOLIC PANEL
ALT: 19 IU/L (ref 0–32)
AST: 16 IU/L (ref 0–40)
Albumin/Globulin Ratio: 1.6 (ref 1.2–2.2)
Albumin: 4.4 g/dL (ref 3.8–4.8)
Alkaline Phosphatase: 70 IU/L (ref 39–117)
BUN/Creatinine Ratio: 13 (ref 9–23)
BUN: 11 mg/dL (ref 6–20)
Bilirubin Total: 0.6 mg/dL (ref 0.0–1.2)
CO2: 24 mmol/L (ref 20–29)
Calcium: 9.3 mg/dL (ref 8.7–10.2)
Chloride: 100 mmol/L (ref 96–106)
Creatinine, Ser: 0.86 mg/dL (ref 0.57–1.00)
GFR calc Af Amer: 103 mL/min/{1.73_m2} (ref >59)
GFR calc non Af Amer: 89 mL/min/{1.73_m2} (ref >59)
Globulin, Total: 2.8 g/dL (ref 1.5–4.5)
Glucose: 71 mg/dL (ref 65–99)
Potassium: 4.2 mmol/L (ref 3.5–5.2)
Sodium: 138 mmol/L (ref 134–144)
Total Protein: 7.2 g/dL (ref 6.0–8.5)

## 2019-03-08 LAB — CBC
Hematocrit: 42.9 % (ref 34.0–46.6)
Hemoglobin: 14.5 g/dL (ref 11.1–15.9)
MCH: 30.4 pg (ref 26.6–33.0)
MCHC: 33.8 g/dL (ref 31.5–35.7)
MCV: 90 fL (ref 79–97)
Platelets: 224 10*3/uL (ref 150–450)
RBC: 4.77 x10E6/uL (ref 3.77–5.28)
RDW: 12 % (ref 11.7–15.4)
WBC: 6.6 10*3/uL (ref 3.4–10.8)

## 2019-03-08 LAB — LIPID PANEL
Chol/HDL Ratio: 1.8 ratio (ref 0.0–4.4)
Cholesterol, Total: 151 mg/dL (ref 100–199)
HDL: 85 mg/dL (ref >39)
LDL Chol Calc (NIH): 57 mg/dL (ref 0–99)
Triglycerides: 34 mg/dL (ref 0–149)
VLDL Cholesterol Cal: 9 mg/dL (ref 5–40)

## 2019-03-08 LAB — TSH: TSH: 1.38 u[IU]/mL (ref 0.450–4.500)

## 2019-03-08 LAB — HEMOGLOBIN A1C
Est. average glucose Bld gHb Est-mCnc: 103 mg/dL
Hgb A1c MFr Bld: 5.2 % (ref 4.8–5.6)

## 2019-03-12 LAB — CYTOLOGY - PAP
Diagnosis: NEGATIVE
High risk HPV: NEGATIVE
Molecular Disclaimer: 56
Molecular Disclaimer: NORMAL

## 2019-07-03 ENCOUNTER — Other Ambulatory Visit: Payer: Self-pay

## 2019-07-03 MED ORDER — FLUOXETINE HCL 10 MG PO CAPS: 10.0000 mg | ORAL_CAPSULE | Freq: Every day | ORAL | 3 refills | Status: DC

## 2019-07-03 MED ORDER — FLUOXETINE HCL 20 MG PO TABS: 20.0000 mg | ORAL_TABLET | Freq: Every day | ORAL | 3 refills | Status: DC

## 2019-09-05 ENCOUNTER — Other Ambulatory Visit: Payer: Self-pay

## 2019-09-05 ENCOUNTER — Encounter: Payer: Self-pay | Admitting: Family Medicine

## 2019-09-05 ENCOUNTER — Ambulatory Visit (INDEPENDENT_AMBULATORY_CARE_PROVIDER_SITE_OTHER): Payer: BC Managed Care – PPO | Admitting: Family Medicine

## 2019-09-05 VITALS — BP 118/64 | HR 83 | Temp 97.8°F | Ht 63.0 in | Wt 162.4 lb

## 2019-09-05 DIAGNOSIS — F411 Generalized anxiety disorder: Secondary | ICD-10-CM | POA: Diagnosis not present

## 2019-09-05 DIAGNOSIS — F988 Other specified behavioral and emotional disorders with onset usually occurring in childhood and adolescence: Secondary | ICD-10-CM

## 2019-09-05 MED ORDER — SERTRALINE HCL 50 MG PO TABS: 50.0000 mg | ORAL_TABLET | Freq: Every day | ORAL | 11 refills | Status: DC

## 2019-09-05 NOTE — Patient Instructions (Signed)
Start zoloft (sertraline) 1/2 pill daily (evening is usually the best) for 1 week and then if well tolerated take one pill daily   If you feel worse or depressed- stop it and let me know  If intolerable side effects - stop and let me know   Follow up in 6-8 weeks   Avoid excess caffeine  Continue the ADD medicine  Continue the oral contraceptive   Take any opportunity to exercise - it helps Good self care is important   If you become interested in counseling -call us and we can do a referral  Talk to friends/family  Writing in a journal is helpful

## 2019-09-05 NOTE — Assessment & Plan Note (Signed)
Sometimes manifesting as irritability/anger  Lifelong but worse with stressors  OC has helped  PMDD (continuous dose) ADD is worsened by anxiety and tx with evekeo is helpful  Pt failed fluoxetine  Not interested in counseling now but perhaps later Good support-disc imp of venting and /or writing in journal  Also exercise/outdoor time (challenging schedule)  Reviewed stressors/ coping techniques/symptoms/ support sources/ tx options and side effects in detail today  Will try sertraline 25 mg -titrate to 50 mg (this may help target anxiety better)  Discussed expectations of SSRI medication including time to effectiveness and mechanism of action, also poss of side effects (early and late)- including mental fuzziness, weight or appetite change, nausea and poss of worse dep or anxiety (even suicidal thoughts)  Pt voiced understanding and will stop med and update if this occurs   Planning f/u in 6-8 weeks (or earlier if needed)

## 2019-09-05 NOTE — Assessment & Plan Note (Signed)
Doing well with Evekeo currently  It does worsen anxiety and vice versa

## 2019-09-05 NOTE — Progress Notes (Signed)
Subjective:    Patient ID: [name], female    DOB: [birth year], 34 y.o.   MRN: [id number]  This visit occurred during the SARS-CoV-2 public health emergency.  Safety protocols were in place, including screening questions prior to the visit, additional usage of staff PPE, and extensive cleaning of exam room while observing appropriate contact time as indicated for disinfecting solutions.    HPI Pt presents to discuss symptom of anxiety   Her mother got covid severely and then recovered   Wt Readings from Last 3 Encounters:  09/05/19 162 lb 6 oz (73.7 kg)  03/07/19 160 lb 8 oz (72.8 kg)  11/20/18 162 lb (73.5 kg)   28.76 kg/m   Has a h/o ADD Takes Evekeo (stimulant/amphetamine) 30 mg bid  That helps quite a bit  She sees Pam Behsimon   Was previously on prozac from gyn  She stopped it about a month ago (was on 30 mg)  She took it about 2 years  Did not think it worked that well overall  No side effects from it   She saw a Veterinary surgeon in 2010 - briefly saw counselor after a miscarriage  ? If it helped   More anxiety and irritability (frustration and anger)  Has a short fuse - it is worse lately  Noticed first in late teens /early 20s   Does not tend to feel depression symptoms very often   She sleeps ok overall (does wake up frequently)  Appetite does not change   No regular exercise - some walking in her neighborhood  Not a lot of time with schedule   Works for [employer] -  Working from home  It is more stressful  With kids there - more difficult  6 yo and 5 yo  Older one is back in school 2 d per week and does need help with school   Daughter plays travel softball  The schedule and games are sometimes stressful    Was worried about her mom when she was sick  She is a Product/process development scientist in general  Worried about forgetting things   Taking OC - did help mood a bit  Helps PMDD    Caffeine -one drink a day    BP Readings from Last 3 Encounters:    09/05/19 118/64  03/07/19 105/78  05/29/18 112/78   Pulse Readings from Last 3 Encounters:  09/05/19 83  03/07/19 85  03/13/18 73   PHQ 9 score of 10 today GAD score of 16 today  Patient Active Problem List   Diagnosis Date Noted  . GAD (generalized anxiety disorder) 09/05/2019  . Routine general medical examination at a health care facility 01/26/2016  . Palpitations 01/18/2013  . Anal fissure 06/02/2011  . Attention deficit disorder 11/24/2006  . ALLERGIC RHINITIS 11/21/2006  . OVARIAN CYST 11/21/2006  . 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 NEPHROLITHIASIS, HX OF 11/21/2006   Past Medical History:  Diagnosis Date  . ADD (attention deficit disorder)   . Allergic rhinitis   . Endometriosis   . Headache(784.0)   . Other acne   . Other and unspecified ovarian cyst   . Personal history of urinary calculi    Past Surgical History:  Procedure Laterality Date  . APPENDECTOMY  3/07  . DILATION AND CURETTAGE OF UTERUS  4/10   miscarriage  . LAPAROSCOPY  2006   pelvic--endometriosis  . MOLE REMOVAL     ? atypical   Social History   Tobacco Use  .  Smoking status: Never Smoker  . Smokeless tobacco: Never Used  Substance Use Topics  . Alcohol use: Yes    Alcohol/week: 0.0 standard drinks    Comment: rare  . Drug use: No   Family History  Problem Relation Age of Onset  . Colon polyps Father   . Other Father        Brain tumor  . Depression Mother    Allergies  Allergen Reactions  . Fexofenadine-Pseudoephed Er     Sudafed  palpatations, felt allegra stopped working  . Pseudoephedrine Palpitations    REACTION: reaction not known   Current Outpatient Medications on File Prior to Visit  Medication Sig Dispense Refill  . cetirizine (ZYRTEC) 10 MG tablet TAKE 1 TABLET BY MOUTH EVERY DAY 30 tablet 3  . EVEKEO 10 MG TABS Take 3 tablets by mouth 2 (two) times daily.  0  . Levonorgestrel-Ethinyl Estradiol (AMETHIA) 0.1-0.02 & 0.01 MG tablet Take 1 tablet by mouth daily. 1  Package 4   No current facility-administered medications on file prior to visit.     Review of Systems  Constitutional: Negative for activity change, appetite change, fatigue, fever and unexpected weight change.  HENT: Negative for congestion, ear pain, rhinorrhea, sinus pressure and sore throat.   Eyes: Negative for pain, redness and visual disturbance.  Respiratory: Negative for cough, shortness of breath and wheezing.   Cardiovascular: Negative for chest pain and palpitations.  Gastrointestinal: Negative for abdominal pain, blood in stool, constipation and diarrhea.  Endocrine: Negative for polydipsia and polyuria.  Genitourinary: Negative for dysuria, frequency and urgency.  Musculoskeletal: Negative for arthralgias, back pain and myalgias.  Skin: Negative for pallor and rash.  Allergic/Immunologic: Negative for environmental allergies.  Neurological: Negative for dizziness, syncope and headaches.  Hematological: Negative for adenopathy. Does not bruise/bleed easily.  Psychiatric/Behavioral: Positive for decreased concentration and sleep disturbance. Negative for dysphoric mood and suicidal ideas. The patient is nervous/anxious.        Objective:   Physical Exam Constitutional:      General: She is not in acute distress.    Appearance: Normal appearance. She is normal weight. She is not ill-appearing.  HENT:     Head: Normocephalic and atraumatic.  Eyes:     General: No scleral icterus.    Conjunctiva/sclera: Conjunctivae normal.     Pupils: Pupils are equal, round, and reactive to light.  Cardiovascular:     Rate and Rhythm: Normal rate and regular rhythm.     Heart sounds: Normal heart sounds.  Pulmonary:     Effort: Pulmonary effort is normal. No respiratory distress.     Breath sounds: Normal breath sounds. No wheezing or rales.  Musculoskeletal:     Cervical back: Normal range of motion and neck supple. No rigidity.  Lymphadenopathy:     Cervical: No cervical  adenopathy.  Skin:    General: Skin is warm and dry.     Coloration: Skin is not pale.  Neurological:     Mental Status: She is alert.     Cranial Nerves: No cranial nerve deficit.     Sensory: No sensory deficit.     Motor: No tremor.     Coordination: Coordination normal.     Deep Tendon Reflexes: Reflexes normal.  Psychiatric:        Attention and Perception: Attention normal.        Mood and Affect: Mood is anxious.        Speech: Speech normal.  Behavior: Behavior normal.        Cognition and Memory: Memory normal.     Comments: Pleasant  Candidly discusses stressors            Assessment & Plan:   Problem List Items Addressed This Visit      Other   Attention deficit disorder    Doing well with Evekeo currently  It does worsen anxiety and vice versa      GAD (generalized anxiety disorder) - Primary    Sometimes manifesting as irritability/anger  Lifelong but worse with stressors  OC has helped  PMDD (continuous dose) ADD is worsened by anxiety and tx with evekeo is helpful  Pt failed fluoxetine  Not interested in counseling now but perhaps later Good support-disc imp of venting and /or writing in journal  Also exercise/outdoor time (challenging schedule)  Reviewed stressors/ coping techniques/symptoms/ support sources/ tx options and side effects in detail today  Will try sertraline 25 mg -titrate to 50 mg (this may help target anxiety better)  Discussed expectations of SSRI medication including time to effectiveness and mechanism of action, also poss of side effects (early and late)- including mental fuzziness, weight or appetite change, nausea and poss of worse dep or anxiety (even suicidal thoughts)  Pt voiced understanding and will stop med and update if this occurs   Planning f/u in 6-8 weeks (or earlier if needed)       Relevant Medications   sertraline (ZOLOFT) 50 MG tablet

## 2019-09-30 ENCOUNTER — Other Ambulatory Visit: Payer: Self-pay | Admitting: Family Medicine

## 2019-10-02 ENCOUNTER — Encounter: Payer: Self-pay | Admitting: Family Medicine

## 2019-10-02 MED ORDER — SERTRALINE HCL 100 MG PO TABS: 100.0000 mg | ORAL_TABLET | Freq: Every day | ORAL | 1 refills | Status: DC

## 2019-10-24 ENCOUNTER — Other Ambulatory Visit: Payer: Self-pay

## 2019-10-24 ENCOUNTER — Ambulatory Visit: Payer: BC Managed Care – PPO | Admitting: Family Medicine

## 2019-10-24 ENCOUNTER — Encounter: Payer: Self-pay | Admitting: Family Medicine

## 2019-10-24 VITALS — BP 102/60 | HR 80 | Temp 98.2°F | Ht 63.0 in | Wt 158.2 lb

## 2019-10-24 DIAGNOSIS — F988 Other specified behavioral and emotional disorders with onset usually occurring in childhood and adolescence: Secondary | ICD-10-CM | POA: Diagnosis not present

## 2019-10-24 DIAGNOSIS — F411 Generalized anxiety disorder: Secondary | ICD-10-CM | POA: Diagnosis not present

## 2019-10-24 NOTE — Progress Notes (Signed)
Subjective:    Patient ID: [name], female    DOB: [birth year], 34 y.o.   MRN: [id number]  This visit occurred during the SARS-CoV-2 public health emergency.  Safety protocols were in place, including screening questions prior to the visit, additional usage of staff PPE, and extensive cleaning of exam room while observing appropriate contact time as indicated for disinfecting solutions.    HPI Pt presents for f/u of chronic medical problem/anxiety  Wt Readings from Last 3 Encounters:  10/24/19 158 lb 4 oz (71.8 kg)  09/05/19 162 lb 6 oz (73.7 kg)  03/07/19 160 lb 8 oz (72.8 kg)   28.03 kg/m   Last visit we started treatment of GAD in the setting of ADD  Px sertraline  Declined counseling   Pt called 4/20 and noted she would like to go up to 100 mg   Thinks it is helping a lot  Does have more anxious days here and there -- when overwhelmed  The 100 mg works better than 50 mg  No side effects   Finding time to take care of herself  Work/kids   BP Readings from Last 3 Encounters:  10/24/19 102/60  09/05/19 118/64  03/07/19 105/78   Her ADD medication changed  Due to insurance coverage-had to stop evekeo and start adderall  Doing ok with that    Patient Active Problem List   Diagnosis Date Noted  . GAD (generalized anxiety disorder) 09/05/2019  . Routine general medical examination at a health care facility 01/26/2016  . Palpitations 01/18/2013  . Anal fissure 06/02/2011  . Attention deficit disorder 11/24/2006  . ALLERGIC RHINITIS 11/21/2006  . OVARIAN CYST 11/21/2006  . 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 NEPHROLITHIASIS, HX OF 11/21/2006   Past Medical History:  Diagnosis Date  . ADD (attention deficit disorder)   . Allergic rhinitis   . Endometriosis   . Headache(784.0)   . Other acne   . Other and unspecified ovarian cyst   . Personal history of urinary calculi    Past Surgical History:  Procedure Laterality Date  . APPENDECTOMY  3/07  . DILATION  AND CURETTAGE OF UTERUS  4/10   miscarriage  . LAPAROSCOPY  2006   pelvic--endometriosis  . MOLE REMOVAL     ? atypical   Social History   Tobacco Use  . Smoking status: Never Smoker  . Smokeless tobacco: Never Used  Substance Use Topics  . Alcohol use: Yes    Alcohol/week: 0.0 standard drinks    Comment: rare  . Drug use: No   Family History  Problem Relation Age of Onset  . Colon polyps Father   . Other Father        Brain tumor  . Depression Mother    Allergies  Allergen Reactions  . Fexofenadine-Pseudoephed Er     Sudafed  palpatations, felt allegra stopped working  . Pseudoephedrine Palpitations    REACTION: reaction not known   Current Outpatient Medications on File Prior to Visit  Medication Sig Dispense Refill  . ADDERALL XR 30 MG 24 hr capsule Take 30 mg by mouth at bedtime.    . cetirizine (ZYRTEC) 10 MG tablet TAKE 1 TABLET BY MOUTH EVERY DAY 30 tablet 3  . Levonorgestrel-Ethinyl Estradiol (AMETHIA) 0.1-0.02 & 0.01 MG tablet Take 1 tablet by mouth daily. 1 Package 4  . sertraline (ZOLOFT) 100 MG tablet Take 1 tablet (100 mg total) by mouth daily. 90 tablet 1   No current facility-administered medications on  file prior to visit.    Review of Systems  Constitutional: Negative for activity change, appetite change, fatigue, fever and unexpected weight change.  HENT: Negative for congestion, ear pain, rhinorrhea, sinus pressure and sore throat.   Eyes: Negative for pain, redness and visual disturbance.  Respiratory: Negative for cough, shortness of breath and wheezing.   Cardiovascular: Negative for chest pain and palpitations.  Gastrointestinal: Negative for abdominal pain, blood in stool, constipation and diarrhea.  Endocrine: Negative for polydipsia and polyuria.  Genitourinary: Negative for dysuria, frequency and urgency.  Musculoskeletal: Negative for arthralgias, back pain and myalgias.  Skin: Negative for pallor and rash.  Allergic/Immunologic:  Negative for environmental allergies.  Neurological: Negative for dizziness, syncope and headaches.  Hematological: Negative for adenopathy. Does not bruise/bleed easily.  Psychiatric/Behavioral: Negative for decreased concentration, dysphoric mood and sleep disturbance. The patient is nervous/anxious.        Objective:   Physical Exam Constitutional:      General: She is not in acute distress.    Appearance: Normal appearance. She is normal weight. She is not ill-appearing.  Eyes:     General: No scleral icterus.    Conjunctiva/sclera: Conjunctivae normal.     Pupils: Pupils are equal, round, and reactive to light.  Cardiovascular:     Rate and Rhythm: Normal rate and regular rhythm.     Pulses: Normal pulses.     Heart sounds: Normal heart sounds.  Pulmonary:     Effort: Pulmonary effort is normal. No respiratory distress.     Breath sounds: Normal breath sounds. No wheezing or rales.  Musculoskeletal:     Cervical back: Neck supple.  Lymphadenopathy:     Cervical: No cervical adenopathy.  Skin:    General: Skin is warm and dry.     Coloration: Skin is not pale.     Findings: No erythema.  Neurological:     Mental Status: She is alert.     Motor: No tremor.     Coordination: Coordination normal.     Gait: Gait normal.     Deep Tendon Reflexes: Reflexes normal.  Psychiatric:        Attention and Perception: Attention normal.        Mood and Affect: Mood normal.        Speech: Speech normal.        Cognition and Memory: Cognition normal.     Comments: Much improved mood  Not anxious today (talks candidly about stressors and times that she is) Good historian Attentive            Assessment & Plan:   Problem List Items Addressed This Visit      Other   Attention deficit disorder    Had to change to adderall due to insurance coverage issues  Doing well with that  Treatment of anxiety has also helped attentiveness      GAD (generalized anxiety disorder) -  Primary    Doing much better with initiation of sertraline and titration to 100 mg  No side effects  Continues adderall as well for ADD Stress is the same/handling it better Reviewed stressors/ coping techniques/symptoms/ support sources/ tx options and side effects in detail today  Commended on good self care (diet/exercise/ weight watchers program)  Will continue this does and alert [place] if any changes Counseling is always an option as well

## 2019-10-24 NOTE — Assessment & Plan Note (Signed)
Had to change to adderall due to insurance coverage issues  Doing well with that  Treatment of anxiety has also helped attentiveness

## 2019-10-24 NOTE — Assessment & Plan Note (Signed)
Doing much better with initiation of sertraline and titration to 100 mg  No side effects  Continues adderall as well for ADD Stress is the same/handling it better Reviewed stressors/ coping techniques/symptoms/ support sources/ tx options and side effects in detail today  Commended on good self care (diet/exercise/ weight watchers program)  Will continue this does and alert [place] if any changes Counseling is always an option as well

## 2019-10-24 NOTE — Patient Instructions (Addendum)
Take care of yourself  I'm glad you are doing better   Keep exercising and eat healthy   Continue your current medicines  Let me know if you have any concerns or problems

## 2020-03-30 ENCOUNTER — Other Ambulatory Visit: Payer: Self-pay | Admitting: Family Medicine

## 2020-04-01 NOTE — Telephone Encounter (Signed)
Last OV 10/24/19 Last fill 10/02/19 #90/1 Please advise.

## 2020-05-05 IMAGING — CR DG ANKLE COMPLETE 3+V*R*
1 series · 3 of 3 positions shown · non-contrast
Comparison: None.

CLINICAL DATA: Moderate right ankle sprain 9 days ago

EXAM:
RIGHT ANKLE - COMPLETE 3+ VIEW

[Series 1: dg ankle complete right · 0.14mm/px · 3 of 3 slices shown]
[im 1/3]
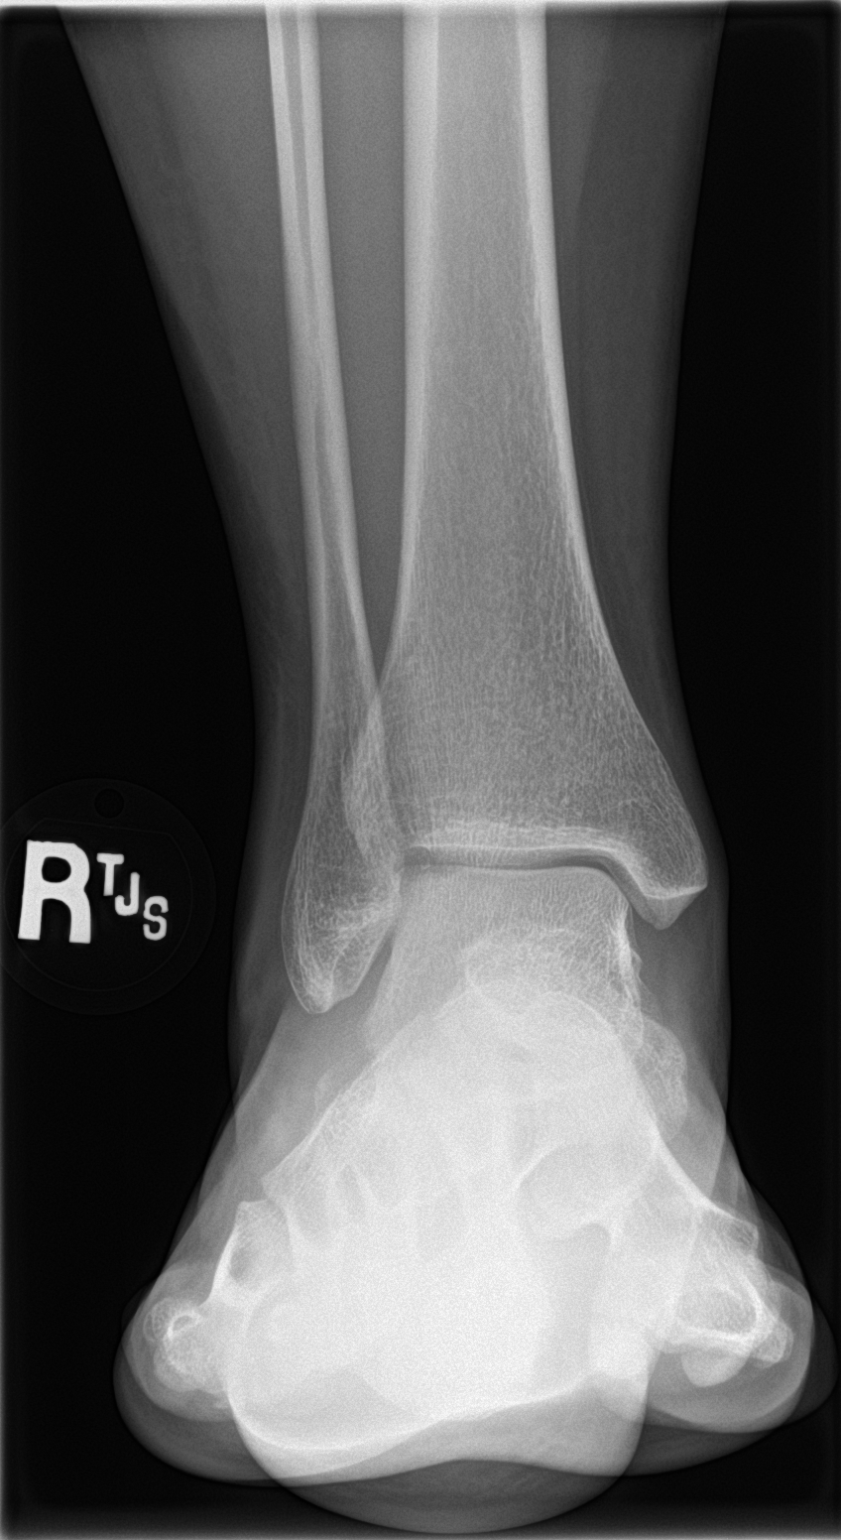
[im 2/3]
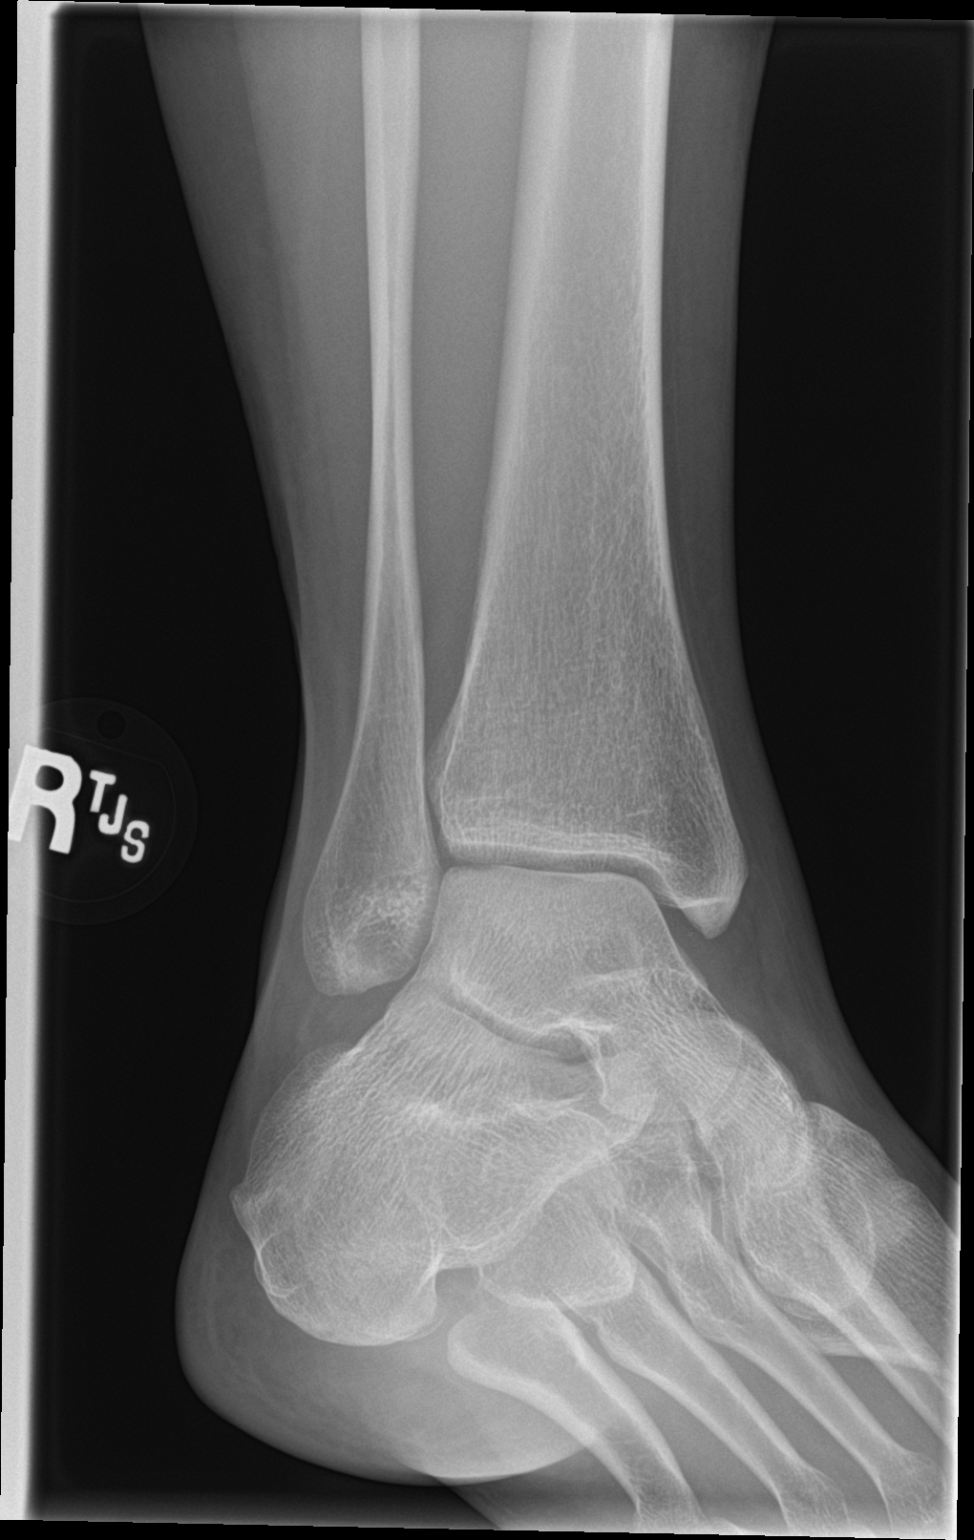
[im 3/3]
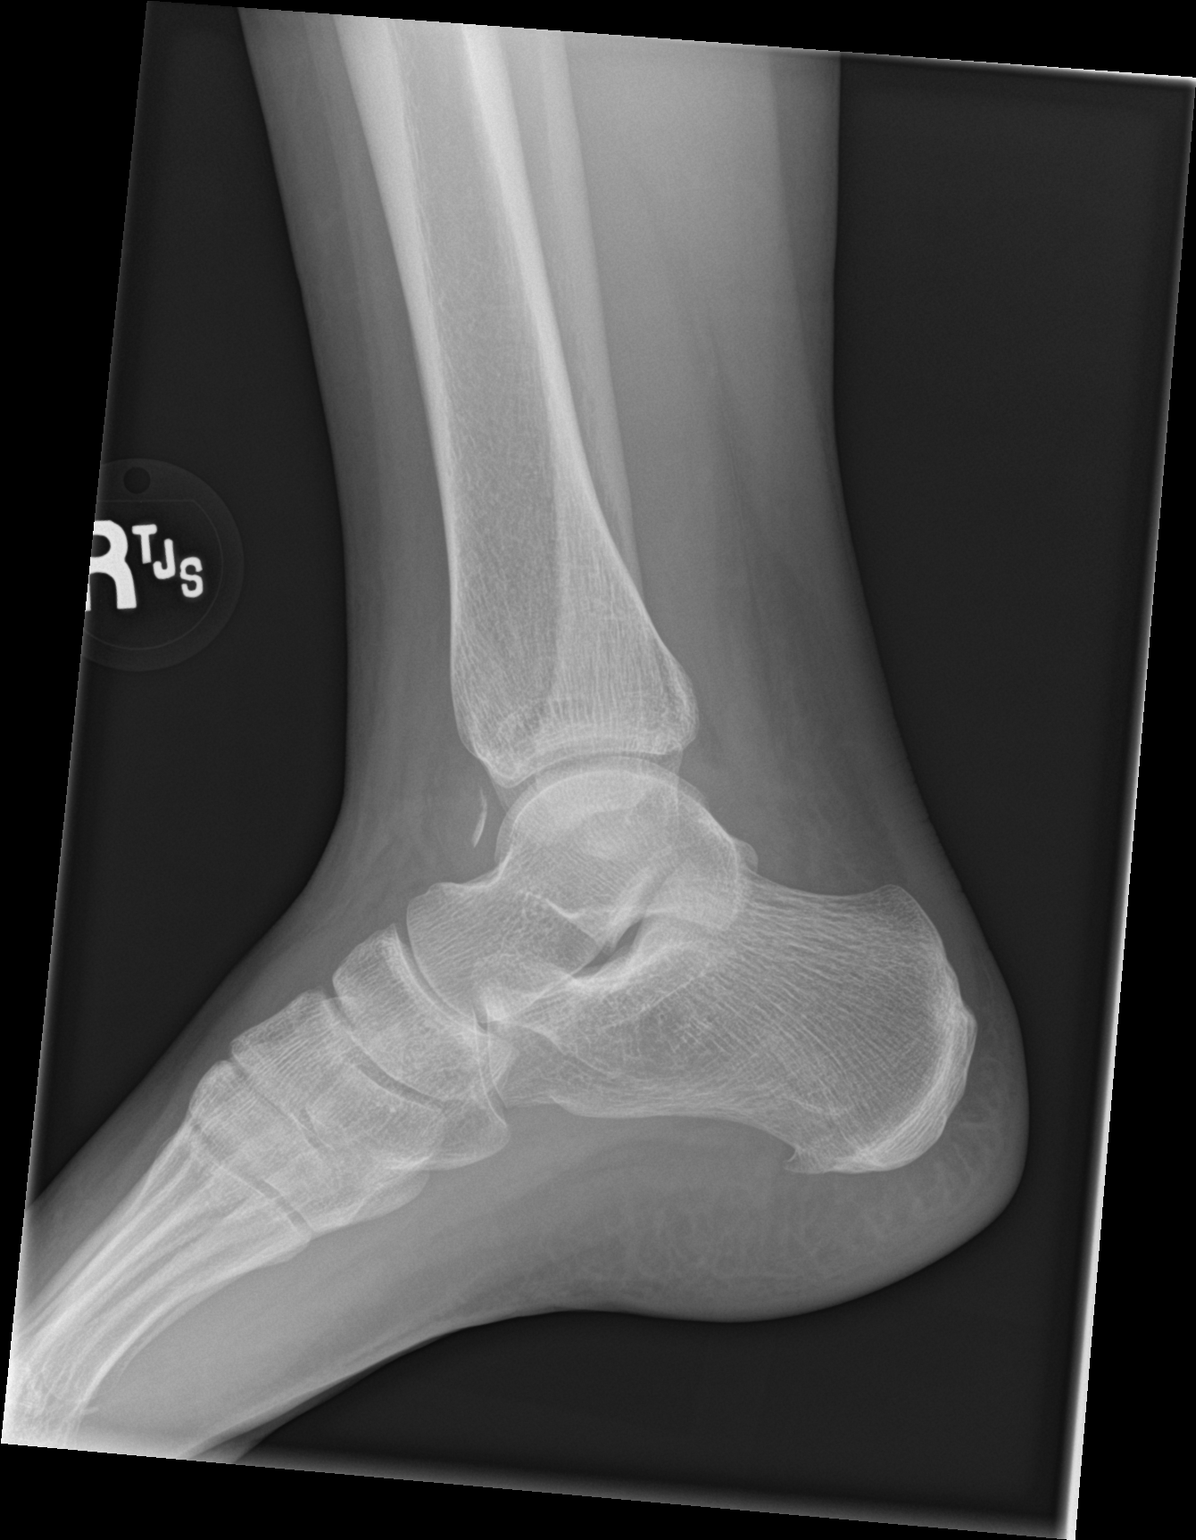

[3 of 3 positions shown; findings below may reference images not displayed]

FINDINGS: Curvilinear calcification noted anterior to the ankle joint on the
lateral view, not visualized on additional views. This could reflect
avulsion off the talus. Joint spaces are maintained. No additional
bony abnormality. Soft tissues are intact.
IMPRESSION: Curvilinear density anterior to the ankle joint on the lateral view,
presumably avulsion fragment off the anterior talus.

## 2020-06-18 ENCOUNTER — Telehealth: Payer: Self-pay

## 2020-06-18 MED ORDER — LEVONORGEST-ETH ESTRAD 91-DAY 0.1-0.02 & 0.01 MG PO TABS: 1.0000 | ORAL_TABLET | Freq: Every day | ORAL | 4 refills | Status: DC

## 2020-06-18 NOTE — Telephone Encounter (Signed)
Spoke to pt and she is aware that her medication has been refilled until her annual exam and sent to [facility] in [place] on [address]

## 2020-06-18 NOTE — Telephone Encounter (Signed)
Patient called in to set up her AE- patient requested to see Dr. Valentino Saxon. Patient is needing a refill on her birth control before then. Could you please advise?

## 2020-06-18 NOTE — Addendum Note (Signed)
Addended by: Silvano Bilis on: 06/18/2020 04:32 PM   Modules accepted: Orders

## 2020-09-15 NOTE — Patient Instructions (Addendum)
Preventive Care 21-35 Years Old, Female Preventive care refers to lifestyle choices and visits with your health care provider that can promote health and wellness. This includes:  A yearly physical exam. This is also called an annual wellness visit.  Regular dental and eye exams.  Immunizations.  Screening for certain conditions.  Healthy lifestyle choices, such as: ? Eating a healthy diet. ? Getting regular exercise. ? Not using drugs or products that contain nicotine and tobacco. ? Limiting alcohol use. What can I expect for my preventive care visit? Physical exam Your health care provider may check your:  Height and weight. These may be used to calculate your BMI (body mass index). BMI is a measurement that tells if you are at a healthy weight.  Heart rate and blood pressure.  Body temperature.  Skin for abnormal spots. Counseling Your health care provider may ask you questions about your:  Past medical problems.  Family's medical history.  Alcohol, tobacco, and drug use.  Emotional well-being.  Home life and relationship well-being.  Sexual activity.  Diet, exercise, and sleep habits.  Work and work environment.  Access to firearms.  Method of birth control.  Menstrual cycle.  Pregnancy history. What immunizations do I need? Vaccines are usually given at various ages, according to a schedule. Your health care provider will recommend vaccines for you based on your age, medical history, and lifestyle or other factors, such as travel or where you work.   What tests do I need? Blood tests  Lipid and cholesterol levels. These may be checked every 5 years starting at age 20.  Hepatitis C test.  Hepatitis B test. Screening  Diabetes screening. This is done by checking your blood sugar (glucose) after you have not eaten for a while (fasting).  STD (sexually transmitted disease) testing, if you are at risk.  BRCA-related cancer screening. This may be  done if you have a family history of breast, ovarian, tubal, or peritoneal cancers.  Pelvic exam and Pap test. This may be done every 3 years starting at age 21. Starting at age 30, this may be done every 5 years if you have a Pap test in combination with an HPV test. Talk with your health care provider about your test results, treatment options, and if necessary, the need for more tests.   Follow these instructions at home: Eating and drinking  Eat a healthy diet that includes fresh fruits and vegetables, whole grains, lean protein, and low-fat dairy products.  Take vitamin and mineral supplements as recommended by your health care provider.  Do not drink alcohol if: ? Your health care provider tells you not to drink. ? You are pregnant, may be pregnant, or are planning to become pregnant.  If you drink alcohol: ? Limit how much you have to 0-1 drink a day. ? Be aware of how much alcohol is in your drink. In the [place] one drink equals one 12 oz bottle of beer (355 mL), one 5 oz glass of wine (148 mL), or one 1 oz glass of hard liquor (44 mL).   Lifestyle  Take daily care of your teeth and gums. Brush your teeth every morning and night with fluoride toothpaste. Floss one time each day.  Stay active. Exercise for at least 30 minutes 5 or more days each week.  Do not use any products that contain nicotine or tobacco, such as cigarettes, e-cigarettes, and chewing tobacco. If you need help quitting, ask your health care provider.  Do not   use drugs.  If you are sexually active, practice safe sex. Use a condom or other form of protection to prevent STIs (sexually transmitted infections).  If you do not wish to become pregnant, use a form of birth control. If you plan to become pregnant, see your health care provider for a prepregnancy visit.  Find healthy ways to cope with stress, such as: ? Meditation, yoga, or listening to music. ? Journaling. ? Talking to a trusted  person. ? Spending time with friends and family. Safety  Always wear your seat belt while driving or riding in a vehicle.  Do not drive: ? If you have been drinking alcohol. Do not ride with someone who has been drinking. ? When you are tired or distracted. ? While texting.  Wear a helmet and other protective equipment during sports activities.  If you have firearms in your house, make sure you follow all gun safety procedures.  Seek help if you have been physically or sexually abused. What's next?  Go to your health care provider once a year for an annual wellness visit.  Ask your health care provider how often you should have your eyes and teeth checked.  Stay up to date on all vaccines. This information is not intended to replace advice given to you by your health care provider. Make sure you discuss any questions you have with your health care provider. Document Revised: 01/27/2020 Document Reviewed: 02/09/2018 Elsevier Patient Education  2021 [place] Breast self-awareness is knowing how your breasts look and feel. Doing breast self-awareness is important. It allows you to catch a breast problem early while it is still small and can be treated. All women should do breast self-awareness, including women who have had breast implants. Tell your doctor if you notice a change in your breasts. What you need:  A mirror.  A well-lit room. How to do a breast self-exam A breast self-exam is one way to learn what is normal for your breasts and to check for changes. To do a breast self-exam: Look for changes 1. Take off all the clothes above your waist. 2. Stand in front of a mirror in a room with good lighting. 3. Put your hands on your hips. 4. Push your hands down. 5. Look at your breasts and nipples in the mirror to see if one breast or nipple looks different from the other. Check to see if: ? The shape of one breast is different. ? The size of one  breast is different. ? There are wrinkles, dips, and bumps in one breast and not the other. 6. Look at each breast for changes in the skin, such as: ? Redness. ? Scaly areas. 7. Look for changes in your nipples, such as: ? Liquid around the nipples. ? Bleeding. ? Dimpling. ? Redness. ? A change in where the nipples are.   Feel for changes 1. Lie on your back on the floor. 2. Feel each breast. To do this, follow these steps: ? Pick a breast to feel. ? Put the arm closest to that breast above your head. ? Use your other arm to feel the nipple area of your breast. Feel the area with the pads of your three middle fingers by making small circles with your fingers. For the first circle, press lightly. For the second circle, press harder. For the third circle, press even harder. ? Keep making circles with your fingers at the different pressures as you move down your breast. Stop when  you feel your ribs. ? Move your fingers a little toward the center of your body. ? Start making circles with your fingers again, this time going up until you reach your collarbone. ? Keep making up-and-down circles until you reach your armpit. Remember to keep using the three pressures. ? Feel the other breast in the same way. 3. Sit or stand in the tub or shower. 4. With soapy water on your skin, feel each breast the same way you did in step 2 when you were lying on the floor.   Write down what you find Writing down what you find can help you remember what to tell your doctor. Write down:  What is normal for each breast.  Any changes you find in each breast, including: ? The kind of changes you find. ? Whether you have pain. ? Size and location of any lumps.  When you last had your menstrual period. General tips  Check your breasts every month.  If you are breastfeeding, the best time to check your breasts is after you feed your baby or after you use a breast pump.  If you get menstrual periods, the  best time to check your breasts is 5-7 days after your menstrual period is over.  With time, you will become comfortable with the self-exam, and you will begin to know if there are changes in your breasts. Contact a doctor if you:  See a change in the shape or size of your breasts or nipples.  See a change in the skin of your breast or nipples, such as red or scaly skin.  Have fluid coming from your nipples that is not normal.  Find a lump or thick area that was not there before.  Have pain in your breasts.  Have any concerns about your breast health. Summary  Breast self-awareness includes looking for changes in your breasts, as well as feeling for changes within your breasts.  Breast self-awareness should be done in front of a mirror in a well-lit room.  You should check your breasts every month. If you get menstrual periods, the best time to check your breasts is 5-7 days after your menstrual period is over.  Let your doctor know of any changes you see in your breasts, including changes in size, changes on the skin, pain or tenderness, or fluid from your nipples that is not normal. This information is not intended to replace advice given to you by your health care provider. Make sure you discuss any questions you have with your health care provider. Document Revised: 01/17/2018 Document Reviewed: 01/17/2018 Elsevier Patient Education  2021 [place].    Laparoscopically Assisted Vaginal Hysterectomy A laparoscopically assisted vaginal hysterectomy (LAVH) is a surgical procedure to remove the uterus and cervix. Sometimes, the ovaries and fallopian tubes are also removed. This surgery may be done to treat problems such as:  Noncancerous growths in the uterus (uterine fibroids) that cause symptoms.  A condition that causes the lining of the uterus to grow in other areas (endometriosis).  Problems with pelvic support.  Cancer of the cervix, ovaries, uterus, or tissue that  lines the uterus (endometrium).  Excessive bleeding in the uterus. During an LAVH, some of the surgical removal is done through the vagina, and the rest is done through a few small incisions in the abdomen. This technique may be an option for women who are not able to have a traditional vaginal hysterectomy. After this procedure, you will no longer be able to  have a baby, and you will no longer have a menstrual period. Tell a health care provider about:  Any allergies you have.  All medicines you are taking, including vitamins, herbs, eye drops, creams, and over-the-counter medicines.  Any problems you or family members have had with anesthetic medicines.  Any blood disorders you have.  Any surgeries you have had.  Any medical conditions you have.  Whether you are pregnant or may be pregnant. What are the risks? Generally, this is a safe procedure. However, problems may occur, including:  Bleeding.  Infection.  Blood clots in the legs or lungs.  Having to change from small incisions to open abdominal surgery.  Allergic reactions to medicines.  Damage to other structures or organs. What happens before the procedure? Staying hydrated Follow instructions from your health care provider about hydration, which may include:  Up to 2 hours before the procedure - you may continue to drink clear liquids, such as water, clear fruit juice, black coffee, and plain tea.   Eating and drinking restrictions Follow instructions from your health care provider about eating and drinking, which may include:  8 hours before the procedure - stop eating heavy meals or foods, such as meat, fried foods, or fatty foods.  6 hours before the procedure - stop eating light meals or foods, such as toast or cereal.  6 hours before the procedure - stop drinking milk or drinks that contain milk.  2 hours before the procedure - stop drinking clear liquids. Medicines  Ask your health care provider  about: ? Changing or stopping your regular medicines. This is especially important if you are taking diabetes medicines or blood thinners. ? Taking medicines such as aspirin and ibuprofen. These medicines can thin your blood. Do not take these medicines unless your health care provider tells you to take them. ? Taking over-the-counter medicines, vitamins, herbs, and supplements.  You may be asked to take a medicine to empty your colon (bowel preparation). General instructions  If you were asked to do bowel preparation before the procedure, follow instructions from your health care provider.  This procedure can affect the way you feel about yourself. Talk with your health care provider about the physical and emotional changes hysterectomy may cause.  Do not use any products that contain nicotine or tobacco for at least 4 weeks before the procedure. These products include cigarettes, chewing tobacco, and vaping devices, such as e-cigarettes. If you need help quitting, ask your health care provider.  Plan to have a responsible adult take you home from the hospital or clinic.  Plan to have a responsible adult care for you for the time you are told after you leave the hospital or clinic. This is important. Surgery safety  Ask your health care provider: ? How your surgery site will be marked. ? What steps will be taken to help prevent infection. These steps may include:  Removing hair at the surgery site.  Washing skin with a germ-killing soap.  Receiving antibiotic medicine. What happens during the procedure?  An IV will be inserted into one of your veins.  You may be given: ? A medicine to help you relax (sedative). ? A medicine to make you fall asleep (general anesthetic). ? A medicine to numb the area (local anesthetic).  You may have a flexible tube (catheter) put into your bladder to drain urine.  Tight-fitting (compression) stockings will be placed on your legs to promote  circulation.  Three or four small incisions will  be made in your abdomen. An incision will also be made in your vagina.  Surgical instruments will be inserted into the small incisions. The uterus and cervix, and possibly the ovaries and fallopian tubes, will be removed through your vagina and through the small incisions in the abdomen.  The incisions will then be closed with stitches (sutures), skin glue, or adhesive strips. The procedure may vary among health care providers and hospitals. What happens after the procedure?  Your blood pressure, heart rate, breathing rate, and blood oxygen level will be monitored until you leave the hospital or clinic.  You will be given pain medicine as needed.  You may have a liquid diet at first. You will most likely return to your usual diet the day after surgery.  You may still have the urinary catheter in place for several hours. It will likely be removed the day after surgery.  You may have to wear compression stockings. These stockings help to prevent blood clots and reduce swelling in your legs.  You will be encouraged to walk as soon as possible. You will also use a device or do breathing exercises to keep your lungs clear.  You will need to wear a sanitary pad for vaginal discharge or bleeding. Summary  A laparoscopically assisted vaginal hysterectomy (LAVH) is a surgical procedure to remove the uterus and cervix, and sometimes the ovaries and fallopian tubes.  Follow instructions from your health care provider about eating and drinking before the procedure.  During an LAVH, some of the surgical removal is done through the vagina, and the rest is done through a few small incisions in the abdomen.  After the procedure you will be given pain medication as needed and will need to wear a sanitary pad.  Plan to have a responsible adult take you home from the hospital or clinic. This information is not intended to replace advice given to you by  your health care provider. Make sure you discuss any questions you have with your health care provider. Document Revised: 02/01/2020 Document Reviewed: 02/01/2020 Elsevier Patient Education  [place].

## 2020-09-16 ENCOUNTER — Encounter: Payer: Self-pay | Admitting: Obstetrics and Gynecology

## 2020-09-16 ENCOUNTER — Ambulatory Visit (INDEPENDENT_AMBULATORY_CARE_PROVIDER_SITE_OTHER): Payer: BC Managed Care – PPO | Admitting: Obstetrics and Gynecology

## 2020-09-16 ENCOUNTER — Other Ambulatory Visit: Payer: Self-pay

## 2020-09-16 VITALS — BP 132/81 | HR 82 | Ht 63.0 in | Wt 168.4 lb

## 2020-09-16 DIAGNOSIS — Z6828 Body mass index (BMI) 28.0-28.9, adult: Secondary | ICD-10-CM

## 2020-09-16 DIAGNOSIS — F411 Generalized anxiety disorder: Secondary | ICD-10-CM

## 2020-09-16 DIAGNOSIS — Z01419 Encounter for gynecological examination (general) (routine) without abnormal findings: Secondary | ICD-10-CM

## 2020-09-16 DIAGNOSIS — N809 Endometriosis, unspecified: Secondary | ICD-10-CM

## 2020-09-16 DIAGNOSIS — N921 Excessive and frequent menstruation with irregular cycle: Secondary | ICD-10-CM

## 2020-09-16 MED ORDER — LEVONORGEST-ETH ESTRAD 91-DAY 0.15-0.03 MG PO TABS: 1.0000 | ORAL_TABLET | Freq: Every day | ORAL | 4 refills | Status: DC

## 2020-09-16 NOTE — Progress Notes (Signed)
GYNECOLOGY ANNUAL PHYSICAL EXAM PROGRESS NOTE  Subjective:    [name] is a 35 y.o. [id number] married female who presents for an annual exam. She is transitioning care from [place], [place]. The patient is sexually active. The patient wears seatbelts: yes. The patient participates in regular exercise: not asked.  The patient has the following complaints today:  Complains of sciatic pain in right leg.  Also notes that her endometriosis has flared up again. Last flare was ~ 5 years ago. Notes heavy prolonged menstrual cycle (started cycle March 10th still ongoing). Also noting intermittent abdominal and pelvic pain.  Also feel very bloated. Is currently on OCPs (continuous method).   She has been dealing with endometriosis since age 35. Has prior history of use of pills, Depot Provera, Lupron, and IUD for management in the past. Notes that she is considering hysterectomy at this point. Has completed childbearing.    Menstrual History:  Menarche age: 40 Patient's last menstrual period was 08/21/2020. Period Duration (Days): 26 Period Pattern: (!) Irregular Menstrual Flow: Heavy,Moderate,Light Menstrual Control: Tampon Menstrual Control Change Freq (Hours): 3 Dysmenorrhea: (!) Moderate Dysmenorrhea Symptoms: Cramping  Gynecologic History: Contraception: OCP (estrogen/progesterone) - continuous method History of STI's: Denies Last Pap: 03/07/2019. Results were: normal.  Denies h/o abnormal pap smears.    Upstream - 09/16/20 0916      Pregnancy Intention Screening   Does the patient want to become pregnant in the next year? No    Does the patient's partner want to become pregnant in the next year? No    Would the patient like to discuss contraceptive options today? No      Contraception Wrap Up   Current Method Oral Contraceptive          The pregnancy intention screening data noted above was reviewed. Potential methods of contraception were discussed. The patient  elected to continue with Oral Contraceptive.     OB History  Gravida Para Term Preterm AB Living  3 2 2  0 1 2  SAB IAB Ectopic Multiple Live Births  1 0 0 0 2    # Outcome Date GA Lbr Len/2nd Weight Sex Delivery Anes PTL Lv  3 Term 03/29/14   7 lb 2 oz (3.232 kg) M Vag-Spont   LIV  2 Term 09/10/09   7 lb 4 oz (3.289 kg) F Vag-Spont   LIV  1 SAB 2010            Past Medical History:  Diagnosis Date  . ADD (attention deficit disorder)   . Allergic rhinitis   . Endometriosis   . Headache(784.0)   . Other acne   . Other and unspecified ovarian cyst   . Personal history of urinary calculi     Past Surgical History:  Procedure Laterality Date  . APPENDECTOMY  3/07  . DILATION AND CURETTAGE OF UTERUS  4/10   miscarriage  . LAPAROSCOPY  2006   pelvic--endometriosis  . MOLE REMOVAL     ? atypical    Family History  Problem Relation Age of Onset  . Colon polyps Father   . Other Father        Brain tumor  . Depression Mother     Social History   Socioeconomic History  . Marital status: Married    Spouse name: Not on file  . Number of children: 1  . Years of education: Not on file  . Highest education level: Not on file  Occupational History  .  Occupation: Nurse, learning disability  Tobacco Use  . Smoking status: Never Smoker  . Smokeless tobacco: Never Used  Vaping Use  . Vaping Use: Never used  Substance and Sexual Activity  . Alcohol use: Yes    Alcohol/week: 0.0 standard drinks    Comment: rare  . Drug use: No  . Sexual activity: Yes    Birth control/protection: Pill  Other Topics Concern  . Not on file  Social History Narrative   Married (Husband works nights--police work)      1 daughter (2011)      Nurse, learning disability   Social Determinants of Corporate investment banker Strain: Not on [employer] Insecurity: Not on file  Transportation Needs: Not on file  Physical Activity: Not on file  Stress: Not on file  Social Connections: Not on file  Intimate  Partner Violence: Not on file    Current Outpatient Medications on File Prior to Visit  Medication Sig Dispense Refill  . ADDERALL XR 30 MG 24 hr capsule Take 30 mg by mouth at bedtime.    . cetirizine (ZYRTEC) 10 MG tablet TAKE 1 TABLET BY MOUTH EVERY DAY 30 tablet 3  . Levonorgestrel-Ethinyl Estradiol (AMETHIA) 0.1-0.02 & 0.01 MG tablet Take 1 tablet by mouth daily. 28 tablet 4  . sertraline (ZOLOFT) 100 MG tablet TAKE 1 TABLET BY MOUTH EVERY DAY 90 tablet 1   No current facility-administered medications on file prior to visit.    Allergies  Allergen Reactions  . Fexofenadine-Pseudoephed Er     Sudafed  palpatations, felt allegra stopped working  . Pseudoephedrine Palpitations    REACTION: reaction not known      Review of Systems Constitutional: negative for chills, fatigue, fevers and sweats Eyes: negative for irritation, redness and visual disturbance Ears, nose, mouth, throat, and face: negative for hearing loss, nasal congestion, snoring and tinnitus Respiratory: negative for asthma, cough, sputum Cardiovascular: negative for chest pain, dyspnea, exertional chest pressure/discomfort, irregular heart beat, palpitations and syncope Gastrointestinal: negative for abdominal pain, change in bowel habits, nausea and vomiting Genitourinary: See HPI for pertinent positives.  Negative for sexual problems and vaginal discharge, dysuria and urinary incontinence. Integument/breast: negative for breast lump, breast tenderness and nipple discharge Hematologic/lymphatic: negative for bleeding and easy bruising Musculoskeletal:negative for back pain and muscle weakness Neurological: negative for dizziness, headaches, vertigo and weakness. Endocrine: negative for diabetic symptoms including polydipsia, polyuria and skin dryness Allergic/Immunologic: negative for hay fever and urticaria        Objective:  Blood pressure 132/81, pulse 82, height 5\' 3"  (1.6 m), weight 168 lb 6.4 oz (76.4  kg), last menstrual period 08/21/2020. Body mass index is 29.83 kg/m.  General Appearance:    Alert, cooperative, no distress, appears stated age, overweight  Head:    Normocephalic, without obvious abnormality, atraumatic  Eyes:    PERRL, conjunctiva/corneas clear, EOM's intact, both eyes  Ears:    Normal external ear canals, both ears  Nose:   Nares normal, septum midline, mucosa normal, no drainage or sinus tenderness  Throat:   Lips, mucosa, and tongue normal; teeth and gums normal  Neck:   Supple, symmetrical, trachea midline, no adenopathy; thyroid: no enlargement/tenderness/nodules; no carotid bruit or JVD  Back:     Symmetric, no curvature, ROM normal, no CVA tenderness  Lungs:     Clear to auscultation bilaterally, respirations unlabored  Chest Wall:    No tenderness or deformity   Heart:    Regular rate and rhythm, S1 and  S2 normal, no murmur, rub or gallop  Breast Exam:    No tenderness, masses, or nipple abnormality  Abdomen:     Soft, non-tender, bowel sounds active all four quadrants, no masses, no organomegaly.    Genitalia:    Pelvic:external genitalia normal, vagina without lesions, or tenderness, rectovaginal septum  Normal. Small amount of dark red blood in vaginal vault.  Cervix normal in appearance, no cervical motion tenderness, no adnexal masses or tenderness.  Uterus normal size, shape, mobile, regular contours, nontender.  Rectal:    Normal external sphincter.  No hemorrhoids appreciated. Internal exam not done.   Extremities:   Extremities normal, atraumatic, no cyanosis or edema  Pulses:   2+ and symmetric all extremities  Skin:   Skin color, texture, turgor normal, no rashes or lesions  Lymph nodes:   Cervical, supraclavicular, and axillary nodes normal  Neurologic:   CNII-XII intact, normal strength, sensation and reflexes throughout    GAD 7 : Generalized Anxiety Score 09/16/2020 09/05/2019 03/07/2019  Nervous, Anxious, on Edge 0 3 3  Control/stop worrying 0 2 2   Worry too much - different things 0 2 3  Trouble relaxing 0 2 3  Restless 0 1 2  Easily annoyed or irritable 0 3 3  Afraid - awful might happen 0 3 1  Total GAD 7 Score 0 16 17  Anxiety Difficulty - - Somewhat difficult      Labs:  Lab Results  Component Value Date   WBC 6.6 03/07/2019   HGB 14.5 03/07/2019   HCT 42.9 03/07/2019   MCV 90 03/07/2019   PLT 224 03/07/2019    Lab Results  Component Value Date   CREATININE 0.86 03/07/2019   BUN 11 03/07/2019   NA 138 03/07/2019   K 4.2 03/07/2019   CL 100 03/07/2019   CO2 24 03/07/2019    Lab Results  Component Value Date   ALT 19 03/07/2019   AST 16 03/07/2019   ALKPHOS 70 03/07/2019   BILITOT 0.6 03/07/2019    Lab Results  Component Value Date   TSH 1.380 03/07/2019     Assessment:   1. Encounter for well woman exam with routine gynecological exam   2. BMI 28.0-28.9,adult   3. GAD (generalized anxiety disorder)   4. Endometriosis   5. Prolonged menstrual cycle    Plan:     - Blood tests: None ordered. Patient had labs performed by her job last month. Will bring records. - Breast self exam technique reviewed and patient encouraged to perform self-exam monthly. - Contraception: OCP (estrogen/progesterone). Using continuously, however noting breakthrough bleeding with prolonged cycle recently on 3rd month of pills. Discussed option of increasing dose of birth control tablets as she is on 20 mcg pill.  Will increase and prescribe Seasonale.  - Discussed healthy lifestyle modifications. - Pap smear up to date.  - History of endometriosis, currently not well managed on OCPs. Discussion had on option management, including trial of Orilissa, increasing dose of current OCPs, use of Aygestin or Danazol, or definitive management with hysterectomy as she no longer desires childbearing.  Patient of note reports that she was offered a hysterectomy several years ago, but at that time was not ready. Thinks that she is now  more amenable to option. Will give information on hysterectomy (would likely perform via LAVH) as well as Dewayne Hatch. Patient to follow up once management option is decided upon.  - COVID vaccination status: declines.  - Flu vaccine: declines. - Follow  up in 1 year for annual exam.  Can f/u sooner once decision is made on management option for endometriosis.     Hildred Laserherry, Karlisha Mathena, MD [facility]

## 2020-09-16 NOTE — Progress Notes (Signed)
Pt present for annual exam. Pt's LMP 08/21/2020 and still bleeding. Pt would like to discuss options for endometriosis pain. PHQ-9=2 GAD-7=0.

## 2020-09-23 ENCOUNTER — Other Ambulatory Visit: Payer: Self-pay

## 2020-09-23 ENCOUNTER — Encounter: Payer: Self-pay | Admitting: Obstetrics and Gynecology

## 2020-09-23 ENCOUNTER — Ambulatory Visit: Payer: BC Managed Care – PPO | Admitting: Obstetrics and Gynecology

## 2020-09-23 VITALS — BP 126/84 | HR 111 | Ht 63.0 in | Wt 167.6 lb

## 2020-09-23 DIAGNOSIS — Z0289 Encounter for other administrative examinations: Secondary | ICD-10-CM

## 2020-09-23 DIAGNOSIS — M5431 Sciatica, right side: Secondary | ICD-10-CM | POA: Diagnosis not present

## 2020-09-23 DIAGNOSIS — N809 Endometriosis, unspecified: Secondary | ICD-10-CM

## 2020-09-23 DIAGNOSIS — Z719 Counseling, unspecified: Secondary | ICD-10-CM | POA: Diagnosis not present

## 2020-09-23 NOTE — Patient Instructions (Signed)
Laparoscopically Assisted Vaginal Hysterectomy A laparoscopically assisted vaginal hysterectomy (LAVH) is a surgical procedure to remove the uterus and cervix. Sometimes, the ovaries and fallopian tubes are also removed. This surgery may be done to treat problems such as:  Noncancerous growths in the uterus (uterine fibroids) that cause symptoms.  A condition that causes the lining of the uterus to grow in other areas (endometriosis).  Problems with pelvic support.  Cancer of the cervix, ovaries, uterus, or tissue that lines the uterus (endometrium).  Excessive bleeding in the uterus. During an LAVH, some of the surgical removal is done through the vagina, and the rest is done through a few small incisions in the abdomen. This technique may be an option for women who are not able to have a traditional vaginal hysterectomy. After this procedure, you will no longer be able to have a baby, and you will no longer have a menstrual period. Tell a health care provider about:  Any allergies you have.  All medicines you are taking, including vitamins, herbs, eye drops, creams, and over-the-counter medicines.  Any problems you or family members have had with anesthetic medicines.  Any blood disorders you have.  Any surgeries you have had.  Any medical conditions you have.  Whether you are pregnant or may be pregnant. What are the risks? Generally, this is a safe procedure. However, problems may occur, including:  Bleeding.  Infection.  Blood clots in the legs or lungs.  Having to change from small incisions to open abdominal surgery.  Allergic reactions to medicines.  Damage to other structures or organs. What happens before the procedure? Staying hydrated Follow instructions from your health care provider about hydration, which may include:  Up to 2 hours before the procedure - you may continue to drink clear liquids, such as water, clear fruit juice, black coffee, and plain tea.    Eating and drinking restrictions Follow instructions from your health care provider about eating and drinking, which may include:  8 hours before the procedure - stop eating heavy meals or foods, such as meat, fried foods, or fatty foods.  6 hours before the procedure - stop eating light meals or foods, such as toast or cereal.  6 hours before the procedure - stop drinking milk or drinks that contain milk.  2 hours before the procedure - stop drinking clear liquids. Medicines  Ask your health care provider about: ? Changing or stopping your regular medicines. This is especially important if you are taking diabetes medicines or blood thinners. ? Taking medicines such as aspirin and ibuprofen. These medicines can thin your blood. Do not take these medicines unless your health care provider tells you to take them. ? Taking over-the-counter medicines, vitamins, herbs, and supplements.  You may be asked to take a medicine to empty your colon (bowel preparation). General instructions  If you were asked to do bowel preparation before the procedure, follow instructions from your health care provider.  This procedure can affect the way you feel about yourself. Talk with your health care provider about the physical and emotional changes hysterectomy may cause.  Do not use any products that contain nicotine or tobacco for at least 4 weeks before the procedure. These products include cigarettes, chewing tobacco, and vaping devices, such as e-cigarettes. If you need help quitting, ask your health care provider.  Plan to have a responsible adult take you home from the hospital or clinic.  Plan to have a responsible adult care for you for the time  you are told after you leave the hospital or clinic. This is important. Surgery safety  Ask your health care provider: ? How your surgery site will be marked. ? What steps will be taken to help prevent infection. These steps may include:  Removing  hair at the surgery site.  Washing skin with a germ-killing soap.  Receiving antibiotic medicine. What happens during the procedure?  An IV will be inserted into one of your veins.  You may be given: ? A medicine to help you relax (sedative). ? A medicine to make you fall asleep (general anesthetic). ? A medicine to numb the area (local anesthetic).  You may have a flexible tube (catheter) put into your bladder to drain urine.  Tight-fitting (compression) stockings will be placed on your legs to promote circulation.  Three or four small incisions will be made in your abdomen. An incision will also be made in your vagina.  Surgical instruments will be inserted into the small incisions. The uterus and cervix, and possibly the ovaries and fallopian tubes, will be removed through your vagina and through the small incisions in the abdomen.  The incisions will then be closed with stitches (sutures), skin glue, or adhesive strips. The procedure may vary among health care providers and hospitals. What happens after the procedure?  Your blood pressure, heart rate, breathing rate, and blood oxygen level will be monitored until you leave the hospital or clinic.  You will be given pain medicine as needed.  You may have a liquid diet at first. You will most likely return to your usual diet the day after surgery.  You may still have the urinary catheter in place for several hours. It will likely be removed the day after surgery.  You may have to wear compression stockings. These stockings help to prevent blood clots and reduce swelling in your legs.  You will be encouraged to walk as soon as possible. You will also use a device or do breathing exercises to keep your lungs clear.  You will need to wear a sanitary pad for vaginal discharge or bleeding. Summary  A laparoscopically assisted vaginal hysterectomy (LAVH) is a surgical procedure to remove the uterus and cervix, and sometimes the  ovaries and fallopian tubes.  Follow instructions from your health care provider about eating and drinking before the procedure.  During an LAVH, some of the surgical removal is done through the vagina, and the rest is done through a few small incisions in the abdomen.  After the procedure you will be given pain medication as needed and will need to wear a sanitary pad.  Plan to have a responsible adult take you home from the hospital or clinic. This information is not intended to replace advice given to you by your health care provider. Make sure you discuss any questions you have with your health care provider. Document Revised: 02/01/2020 Document Reviewed: 02/01/2020 Elsevier Patient Education  2021 [employer].

## 2020-09-23 NOTE — Progress Notes (Signed)
Pt present to discuss hysterectomy surgery. Pt stated that she was doing well.

## 2020-09-23 NOTE — Progress Notes (Addendum)
    GYNECOLOGY PROGRESS NOTE  Subjective:    Patient ID: [name], female    DOB: 03-20-1986, 35 y.o.   MRN: [id number]  HPI  Patient is a 35 y.o. [id number] female who presents for further discussion of hysterectomy.  Has a history of endometriosis, currently on continuous OCPs,  Last flare of endometriosis was ~ 5 years ago. Notes heavy prolonged menstrual cycle (started cycle March 10th still ongoing).  Also experiencing sciatic right leg pain (which often occurs with her flares of endometriosis).  Also noting intermittent abdominal and pelvic pain.  Also feel very bloated. Is currently on OCPs (continuous method).   She has been dealing with endometriosis since age 13. Has prior history of use of pills, Depot Provera, Lupron, and IUD for management in the past. Notes that she is considering hysterectomy at this point. Has completed childbearing. Desires definitive surgical management.   The following portions of the patient's history were reviewed and updated as appropriate: allergies, current medications, past family history, past medical history, past social history, past surgical history and problem list.  Review of Systems Pertinent items noted in HPI and remainder of comprehensive ROS otherwise negative.   Objective:   Blood pressure 126/84, pulse (!) 111, height 5\' 3"  (1.6 m), weight 167 lb 9.6 oz (76 kg), last menstrual period 08/21/2020. General appearance: alert and no distress Remainder of exam. Please refer to physical exam from wellness visit last week, 09/16/2020.    Assessment:   Surgery consultation Endometriosis Sciatic pain right leg  Plan:   Patient desires definitive management with hysterectomy.  I proposed doing a laparoscopic-assisted vaginal hysterectomy (LAVH), prophylactic bilateral salpingectomy, and excision of endometriosis if present. No indication for oophorectomy at this time unless significant disease is present on surgical evaluation.  Patient  agrees with this proposed surgery.  The risks of surgery were discussed in detail with the patient including but not limited to: bleeding which may require transfusion or reoperation; infection which may require antibiotics; injury to bowel, bladder, ureters or other surrounding organs; need for additional procedures including laparotomy or subsequent procedures secondary to abnormal pathology; formation of adhesions; thromboembolic phenomenon; incisional problems and other postoperative/anesthesia complications.  Patient was also advised that she will remain in house for 1 night; and expected recovery time after a hysterectomy is 6-8 weeks.  Patient was told that the likelihood that her condition and symptoms will be treated effectively with this surgical management was very high; the postoperative expectations were also discussed in detail. The patient also understands the alternative treatment options which were discussed in full. All questions were answered.  She was told that she will be contacted by our surgical scheduler regarding the time and date of her surgery; routine preoperative instructions will be given to her by the preoperative nursing team.   She is aware of need for preoperative COVID testing and subsequent quarantine from time of test to time of surgery; she will be given further preoperative instructions at that COVID screening visit.   Routine postoperative instructions will be reviewed with the patient in detail after surgery.  In the meantime, she will continue OCPs; bleeding precautions were reviewed. Printed patient education handouts about the procedure was given to the patient to review at home.  A total of 15 minutes were spent face-to-face with the patient during this encounter and over half of that time dealt with counseling and coordination of care.  11/16/2020, MD [facility]

## 2020-10-04 ENCOUNTER — Other Ambulatory Visit: Payer: Self-pay | Admitting: Family Medicine

## 2020-10-06 NOTE — Telephone Encounter (Signed)
Please schedule a f/u appt and refill until then 

## 2020-10-06 NOTE — Telephone Encounter (Signed)
No recent or future appts., please advise  

## 2020-10-07 NOTE — Telephone Encounter (Signed)
Zoloft refilled once and will route to [facility] to f/u to get appt scheduled

## 2020-10-27 ENCOUNTER — Encounter
Admission: RE | Admit: 2020-10-27 | Discharge: 2020-10-27 | Disposition: A | Discharge status: Home / Self Care | Payer: BC Managed Care – PPO | Source: Ambulatory Visit | Attending: Obstetrics and Gynecology | Admitting: Obstetrics and Gynecology

## 2020-10-27 ENCOUNTER — Other Ambulatory Visit: Payer: Self-pay

## 2020-10-27 HISTORY — DX: Anxiety disorder, unspecified: F41.9

## 2020-10-27 NOTE — Patient Instructions (Signed)
Your procedure is scheduled on: Nov 03, 2020 MONDAY Report to the Registration Desk on the 1st floor of the [facility]. To find out your arrival time, please call [phone] between 1PM - 3PM on: Friday Oct 31, 2020  REMEMBER: Instructions that are not followed completely may result in serious medical risk, up to and including death; or upon the discretion of your surgeon and anesthesiologist your surgery may need to be rescheduled.  Do not eat food after midnight the night before surgery.  No gum chewing, lozengers or hard candies.  You may however, drink CLEAR liquids up to 2 hours before you are scheduled to arrive for your surgery. Do not drink anything within 2 hours of your scheduled arrival time.  Clear liquids include: - water  - apple juice without pulp - gatorade (not RED, PURPLE, OR BLUE) - black coffee or tea (Do NOT add milk or creamers to the coffee or tea) Do NOT drink anything that is not on this list.  Type 1 and Type 2 diabetics should only drink water.  In addition, your doctor has ordered for you to drink the provided  Ensure Pre-Surgery Clear Carbohydrate Drink  Drinking this carbohydrate drink up to two hours before surgery helps to reduce insulin resistance and improve patient outcomes. Please complete drinking 2 hours prior to scheduled arrival time.  TAKE THESE MEDICATIONS THE MORNING OF SURGERY WITH A SIP OF WATER: ZOLOFT   One week prior to surgery: Stop Anti-inflammatories (NSAIDS) such as Advil, Aleve, Ibuprofen, Motrin, Naproxen, Naprosyn and ASPIRIN OR Aspirin based products such as Excedrin, Goodys Powder, BC Powder.            USE TYLENOL IF NEEDED FOR PAIN Stop ANY OVER THE COUNTER supplements until after surgery.  No Alcohol for 24 hours before or after surgery.  No Smoking including e-cigarettes for 24 hours prior to surgery.  No chewable tobacco products for at least 6 hours prior to surgery.  No nicotine patches on the day of  surgery.  Do not use any "recreational" drugs for at least a week prior to your surgery.  Please be advised that the combination of cocaine and anesthesia may have negative outcomes, up to and including death. If you test positive for cocaine, your surgery will be cancelled.  On the morning of surgery brush your teeth with toothpaste and water, you may rinse your mouth with mouthwash if you wish. Do not swallow any toothpaste or mouthwash.  Do not wear jewelry, make-up, hairpins, clips or nail polish.  Do not wear lotions, powders, or perfumes OR DEODORANT   Do not shave body from the neck down 48 hours prior to surgery just in case you cut yourself which could leave a site for infection.  Also, freshly shaved skin may become irritated if using the CHG soap.  Contact lenses, hearing aids and dentures may not be worn into surgery.  Do not bring valuables to the hospital. [facility] is not responsible for any missing/lost belongings or valuables.   Use CHG Soap as directed on instruction sheet.  Notify your doctor if there is any change in your medical condition (cold, fever, infection).  Wear comfortable clothing (specific to your surgery type) to the hospital.  Plan for stool softeners for home use; pain medications have a tendency to cause constipation. You can also help prevent constipation by eating foods high in fiber such as fruits and vegetables and drinking plenty of fluids as your diet allows.  After  surgery, you can help prevent lung complications by doing breathing exercises.  Take deep breaths and cough every 1-2 hours. Your doctor may order a device called an Incentive Spirometer to help you take deep breaths. When coughing or sneezing, hold a pillow firmly against your incision with both hands. This is called "splinting." Doing this helps protect your incision. It also decreases belly discomfort.  If you are being admitted to the hospital overnight, YOU MAY BRING A  SMALL BAG WITH YOU.   If you are being discharged the day of surgery, you will not be allowed to drive home. You will need a responsible adult (18 years or older) to drive you home and stay with you that night.   If you are taking public transportation, you will need to have a responsible adult (18 years or older) with you. Please confirm with your physician that it is acceptable to use public transportation.   Please call the Pre-admissions Testing Dept. at [phone] if you have any questions about these instructions.  Surgery Visitation Policy:  Patients undergoing a surgery or procedure may have one family member or support person with them as long as that person is not COVID-19 positive or experiencing its symptoms.  That person may remain in the waiting area during the procedure.  Inpatient Visitation:    Visiting hours are 7 a.m. to 8 p.m. Inpatients will be allowed two visitors daily. The visitors may change each day during the patient's stay. No visitors under the age of 31. Any visitor under the age of 65 must be accompanied by an adult. The visitor must pass COVID-19 screenings, use hand sanitizer when entering and exiting the patient's room and wear a mask at all times, including in the patient's room. Patients must also wear a mask when staff or their visitor are in the room. Masking is required regardless of vaccination status.

## 2020-10-30 ENCOUNTER — Other Ambulatory Visit: Payer: Self-pay

## 2020-10-30 ENCOUNTER — Other Ambulatory Visit
Admission: RE | Admit: 2020-10-30 | Discharge: 2020-10-30 | Disposition: A | Discharge status: Home / Self Care | Payer: BC Managed Care – PPO | Source: Ambulatory Visit | Attending: Obstetrics and Gynecology | Admitting: Obstetrics and Gynecology

## 2020-10-30 DIAGNOSIS — Z01812 Encounter for preprocedural laboratory examination: Secondary | ICD-10-CM | POA: Diagnosis not present

## 2020-10-30 DIAGNOSIS — Z20822 Contact with and (suspected) exposure to covid-19: Secondary | ICD-10-CM | POA: Insufficient documentation

## 2020-10-30 LAB — HEPATITIS C ANTIBODY: HCV Ab: NONREACTIVE

## 2020-10-30 LAB — CBC
HCT: 43.8 % (ref 36.0–46.0)
Hemoglobin: 15 g/dL (ref 12.0–15.0)
MCH: 31.8 pg (ref 26.0–34.0)
MCHC: 34.2 g/dL (ref 30.0–36.0)
MCV: 93 fL (ref 80.0–100.0)
Platelets: 222 10*3/uL (ref 150–400)
RBC: 4.71 MIL/uL (ref 3.87–5.11)
RDW: 11.9 % (ref 11.5–15.5)
WBC: 7.7 10*3/uL (ref 4.0–10.5)
nRBC: 0 % (ref 0.0–0.2)

## 2020-10-30 LAB — COMPREHENSIVE METABOLIC PANEL
ALT: 16 U/L (ref 0–44)
AST: 15 U/L (ref 15–41)
Albumin: 4.2 g/dL (ref 3.5–5.0)
Alkaline Phosphatase: 48 U/L (ref 38–126)
Anion gap: 10 (ref 5–15)
BUN: 9 mg/dL (ref 6–20)
CO2: 23 mmol/L (ref 22–32)
Calcium: 8.9 mg/dL (ref 8.9–10.3)
Chloride: 104 mmol/L (ref 98–111)
Creatinine, Ser: 0.73 mg/dL (ref 0.44–1.00)
GFR, Estimated: >60 mL/min (ref >60)
Glucose, Bld: 76 mg/dL (ref 70–99)
Potassium: 4 mmol/L (ref 3.5–5.1)
Sodium: 137 mmol/L (ref 135–145)
Total Bilirubin: 0.5 mg/dL (ref 0.3–1.2)
Total Protein: 7.9 g/dL (ref 6.5–8.1)

## 2020-10-30 LAB — SARS CORONAVIRUS 2 (TAT 6-24 HRS): SARS Coronavirus 2: NEGATIVE

## 2020-10-30 LAB — RAPID HIV SCREEN (HIV 1/2 AB+AG)
HIV 1/2 Antibodies: NONREACTIVE
HIV-1 P24 Antigen - HIV24: NONREACTIVE

## 2020-10-31 LAB — HEPATITIS B SURFACE ANTIBODY, QUANTITATIVE: Hep B S AB Quant (Post): 66.6 m[IU]/mL (ref >9.9)

## 2020-10-31 LAB — RPR: RPR Ser Ql: NONREACTIVE

## 2020-11-03 ENCOUNTER — Observation Stay
Admission: RE | Admit: 2020-11-03 | Discharge: 2020-11-04 | Disposition: A | Discharge status: Home / Self Care | Payer: BC Managed Care – PPO | Attending: Obstetrics and Gynecology | Admitting: Obstetrics and Gynecology

## 2020-11-03 ENCOUNTER — Encounter: Payer: Self-pay | Admitting: Obstetrics and Gynecology

## 2020-11-03 ENCOUNTER — Ambulatory Visit: Payer: BC Managed Care – PPO | Admitting: Certified Registered"

## 2020-11-03 ENCOUNTER — Other Ambulatory Visit: Payer: Self-pay

## 2020-11-03 ENCOUNTER — Encounter
Admission: RE | Disposition: A | Discharge status: Home / Self Care | Payer: Self-pay | Source: Home / Self Care | Attending: Obstetrics and Gynecology

## 2020-11-03 DIAGNOSIS — N800 Endometriosis of uterus: Secondary | ICD-10-CM | POA: Insufficient documentation

## 2020-11-03 DIAGNOSIS — N920 Excessive and frequent menstruation with regular cycle: Secondary | ICD-10-CM

## 2020-11-03 DIAGNOSIS — N809 Endometriosis, unspecified: Secondary | ICD-10-CM | POA: Diagnosis not present

## 2020-11-03 DIAGNOSIS — N939 Abnormal uterine and vaginal bleeding, unspecified: Secondary | ICD-10-CM | POA: Diagnosis not present

## 2020-11-03 DIAGNOSIS — N841 Polyp of cervix uteri: Secondary | ICD-10-CM | POA: Diagnosis not present

## 2020-11-03 DIAGNOSIS — Z90710 Acquired absence of both cervix and uterus: Secondary | ICD-10-CM | POA: Diagnosis present

## 2020-11-03 DIAGNOSIS — R87616 Satisfactory cervical smear but lacking transformation zone: Secondary | ICD-10-CM | POA: Diagnosis not present

## 2020-11-03 DIAGNOSIS — R102 Pelvic and perineal pain: Secondary | ICD-10-CM | POA: Diagnosis not present

## 2020-11-03 HISTORY — PX: LAPAROSCOPIC VAGINAL HYSTERECTOMY WITH SALPINGECTOMY: SHX6680

## 2020-11-03 LAB — ABO/RH: ABO/RH(D): O NEG

## 2020-11-03 LAB — POCT PREGNANCY, URINE: Preg Test, Ur: NEGATIVE

## 2020-11-03 SURGERY — HYSTERECTOMY, VAGINAL, LAPAROSCOPY-ASSISTED, WITH SALPINGECTOMY
Anesthesia: General | Laterality: Bilateral

## 2020-11-03 MED ORDER — SUGAMMADEX SODIUM 200 MG/2ML IV SOLN
INTRAVENOUS | Status: DC | PRN
  Administered 2020-11-03: 200 mg via INTRAVENOUS

## 2020-11-03 MED ORDER — ZOLPIDEM TARTRATE 5 MG PO TABS
5.0000 mg | ORAL_TABLET | Freq: Every evening | ORAL | Status: DC | PRN
Start: 2020-11-03 — End: 2020-11-04

## 2020-11-03 MED ORDER — AMPHETAMINE-DEXTROAMPHETAMINE 5 MG PO TABS
10.0000 mg | ORAL_TABLET | Freq: Every day | ORAL | Status: DC
  Administered 2020-11-03: 10 mg via ORAL
  Filled 2020-11-03: qty 2

## 2020-11-03 MED ORDER — VASOPRESSIN 20 UNIT/ML IV SOLN
INTRAVENOUS | Status: AC
  Filled 2020-11-03: qty 1

## 2020-11-03 MED ORDER — OXYCODONE HCL 5 MG PO TABS
5.0000 mg | ORAL_TABLET | ORAL | Status: DC | PRN
  Administered 2020-11-03 (×2): 10 mg via ORAL
  Administered 2020-11-03 – 2020-11-04 (×4): 5 mg via ORAL
  Filled 2020-11-03: qty 2
  Filled 2020-11-03: qty 1
  Filled 2020-11-03: qty 2
  Filled 2020-11-03: qty 1
  Filled 2020-11-03: qty 2
  Filled 2020-11-03 (×2): qty 1

## 2020-11-03 MED ORDER — SENNOSIDES-DOCUSATE SODIUM 8.6-50 MG PO TABS: 1.0000 | ORAL_TABLET | Freq: Every evening | ORAL | Status: DC | PRN

## 2020-11-03 MED ORDER — DEXAMETHASONE SODIUM PHOSPHATE 10 MG/ML IJ SOLN
INTRAMUSCULAR | Status: AC
  Filled 2020-11-03: qty 1

## 2020-11-03 MED ORDER — EPHEDRINE SULFATE 50 MG/ML IJ SOLN
INTRAMUSCULAR | Status: DC | PRN
  Administered 2020-11-03: 10 mg via INTRAVENOUS
  Administered 2020-11-03: 5 mg via INTRAVENOUS

## 2020-11-03 MED ORDER — ORAL CARE MOUTH RINSE: 15.0000 mL | Freq: Once | OROMUCOSAL | Status: AC

## 2020-11-03 MED ORDER — GABAPENTIN 300 MG PO CAPS: 300.0000 mg | ORAL_CAPSULE | ORAL | Status: AC

## 2020-11-03 MED ORDER — DEXAMETHASONE SODIUM PHOSPHATE 10 MG/ML IJ SOLN
INTRAMUSCULAR | Status: DC | PRN
  Administered 2020-11-03: 10 mg via INTRAVENOUS

## 2020-11-03 MED ORDER — MIDAZOLAM HCL 2 MG/2ML IJ SOLN
INTRAMUSCULAR | Status: AC
  Filled 2020-11-03: qty 2

## 2020-11-03 MED ORDER — GABAPENTIN 100 MG PO CAPS
200.0000 mg | ORAL_CAPSULE | Freq: Three times a day (TID) | ORAL | Status: DC
  Administered 2020-11-03 – 2020-11-04 (×3): 200 mg via ORAL
  Filled 2020-11-03 (×3): qty 2

## 2020-11-03 MED ORDER — ONDANSETRON HCL 4 MG/2ML IJ SOLN: 4.0000 mg | Freq: Four times a day (QID) | INTRAMUSCULAR | Status: DC | PRN

## 2020-11-03 MED ORDER — PHENYLEPHRINE HCL (PRESSORS) 10 MG/ML IV SOLN
INTRAVENOUS | Status: DC | PRN
  Administered 2020-11-03: 200 ug via INTRAVENOUS

## 2020-11-03 MED ORDER — HYDROMORPHONE HCL 1 MG/ML IJ SOLN
0.2000 mg | INTRAMUSCULAR | Status: DC | PRN
  Administered 2020-11-03: 0.4 mg via INTRAVENOUS
  Filled 2020-11-03: qty 1

## 2020-11-03 MED ORDER — ACETAMINOPHEN 500 MG PO TABS
1000.0000 mg | ORAL_TABLET | Freq: Four times a day (QID) | ORAL | Status: DC
  Administered 2020-11-03 – 2020-11-04 (×4): 1000 mg via ORAL
  Filled 2020-11-03 (×4): qty 2

## 2020-11-03 MED ORDER — CHLORHEXIDINE GLUCONATE 0.12 % MT SOLN
OROMUCOSAL | Status: AC
  Administered 2020-11-03: 15 mL via OROMUCOSAL
  Filled 2020-11-03: qty 15

## 2020-11-03 MED ORDER — CHLORHEXIDINE GLUCONATE 0.12 % MT SOLN: 15.0000 mL | Freq: Once | OROMUCOSAL | Status: AC

## 2020-11-03 MED ORDER — SILVER NITRATE-POT NITRATE 75-25 % EX MISC
CUTANEOUS | Status: AC
  Filled 2020-11-03: qty 10

## 2020-11-03 MED ORDER — LACTATED RINGERS IV SOLN: INTRAVENOUS | Status: DC

## 2020-11-03 MED ORDER — SODIUM CHLORIDE (PF) 0.9 % IJ SOLN
INTRAMUSCULAR | Status: AC
  Filled 2020-11-03: qty 50

## 2020-11-03 MED ORDER — PANTOPRAZOLE SODIUM 40 MG PO TBEC
40.0000 mg | DELAYED_RELEASE_TABLET | Freq: Every day | ORAL | Status: DC
  Administered 2020-11-04: 40 mg via ORAL
  Filled 2020-11-03: qty 1

## 2020-11-03 MED ORDER — PROPOFOL 10 MG/ML IV BOLUS
INTRAVENOUS | Status: DC | PRN
  Administered 2020-11-03: 200 mg via INTRAVENOUS

## 2020-11-03 MED ORDER — BUPIVACAINE HCL (PF) 0.5 % IJ SOLN
INTRAMUSCULAR | Status: AC
  Filled 2020-11-03: qty 30

## 2020-11-03 MED ORDER — CEFAZOLIN SODIUM-DEXTROSE 2-4 GM/100ML-% IV SOLN
INTRAVENOUS | Status: AC
  Filled 2020-11-03: qty 100

## 2020-11-03 MED ORDER — ONDANSETRON HCL 4 MG/2ML IJ SOLN
INTRAMUSCULAR | Status: AC
  Filled 2020-11-03: qty 2

## 2020-11-03 MED ORDER — POVIDONE-IODINE 10 % EX SWAB
2.0000 "application " | Freq: Once | CUTANEOUS | Status: AC
  Administered 2020-11-03: 2 via TOPICAL

## 2020-11-03 MED ORDER — ALUM & MAG HYDROXIDE-SIMETH 200-200-20 MG/5ML PO SUSP: 30.0000 mL | ORAL | Status: DC | PRN

## 2020-11-03 MED ORDER — ROCURONIUM BROMIDE 100 MG/10ML IV SOLN
INTRAVENOUS | Status: DC | PRN
  Administered 2020-11-03: 20 mg via INTRAVENOUS
  Administered 2020-11-03: 40 mg via INTRAVENOUS

## 2020-11-03 MED ORDER — GABAPENTIN 300 MG PO CAPS
ORAL_CAPSULE | ORAL | Status: AC
  Administered 2020-11-03: 300 mg via ORAL
  Filled 2020-11-03: qty 1

## 2020-11-03 MED ORDER — FENTANYL CITRATE (PF) 100 MCG/2ML IJ SOLN
INTRAMUSCULAR | Status: DC | PRN
  Administered 2020-11-03 (×4): 50 ug via INTRAVENOUS

## 2020-11-03 MED ORDER — KETOROLAC TROMETHAMINE 30 MG/ML IJ SOLN
30.0000 mg | Freq: Four times a day (QID) | INTRAMUSCULAR | Status: AC
  Administered 2020-11-03 – 2020-11-04 (×4): 30 mg via INTRAVENOUS
  Filled 2020-11-03 (×4): qty 1

## 2020-11-03 MED ORDER — FENTANYL CITRATE (PF) 100 MCG/2ML IJ SOLN
INTRAMUSCULAR | Status: AC
  Administered 2020-11-03: 25 ug via INTRAVENOUS
  Filled 2020-11-03: qty 2

## 2020-11-03 MED ORDER — BISACODYL 10 MG RE SUPP: 10.0000 mg | Freq: Every day | RECTAL | Status: DC | PRN

## 2020-11-03 MED ORDER — ONDANSETRON HCL 4 MG/2ML IJ SOLN
INTRAMUSCULAR | Status: DC | PRN
  Administered 2020-11-03: 4 mg via INTRAVENOUS

## 2020-11-03 MED ORDER — FAMOTIDINE 20 MG PO TABS: 20.0000 mg | ORAL_TABLET | Freq: Once | ORAL | Status: AC

## 2020-11-03 MED ORDER — DOCUSATE SODIUM 100 MG PO CAPS
100.0000 mg | ORAL_CAPSULE | Freq: Two times a day (BID) | ORAL | Status: DC
  Administered 2020-11-03 – 2020-11-04 (×2): 100 mg via ORAL
  Filled 2020-11-03 (×2): qty 1

## 2020-11-03 MED ORDER — LIDOCAINE HCL (CARDIAC) PF 100 MG/5ML IV SOSY
PREFILLED_SYRINGE | INTRAVENOUS | Status: DC | PRN
  Administered 2020-11-03: 60 mg via INTRAVENOUS

## 2020-11-03 MED ORDER — MENTHOL 3 MG MT LOZG
1.0000 | LOZENGE | OROMUCOSAL | Status: DC | PRN
  Filled 2020-11-03: qty 9

## 2020-11-03 MED ORDER — ACETAMINOPHEN 500 MG PO TABS: 1000.0000 mg | ORAL_TABLET | ORAL | Status: AC

## 2020-11-03 MED ORDER — VASOPRESSIN 20 UNIT/ML IV SOLN
INTRAVENOUS | Status: DC | PRN
  Administered 2020-11-03: 20 mL via INTRAMUSCULAR

## 2020-11-03 MED ORDER — SIMETHICONE 80 MG PO CHEW: 80.0000 mg | CHEWABLE_TABLET | Freq: Four times a day (QID) | ORAL | Status: DC | PRN

## 2020-11-03 MED ORDER — ONDANSETRON HCL 4 MG PO TABS: 4.0000 mg | ORAL_TABLET | Freq: Four times a day (QID) | ORAL | Status: DC | PRN

## 2020-11-03 MED ORDER — BUPIVACAINE HCL (PF) 0.5 % IJ SOLN
INTRAMUSCULAR | Status: DC | PRN
  Administered 2020-11-03: 18 mL

## 2020-11-03 MED ORDER — MAGNESIUM CITRATE PO SOLN
1.0000 | Freq: Once | ORAL | Status: DC | PRN
  Filled 2020-11-03: qty 296

## 2020-11-03 MED ORDER — IBUPROFEN 600 MG PO TABS: 600.0000 mg | ORAL_TABLET | Freq: Four times a day (QID) | ORAL | Status: DC

## 2020-11-03 MED ORDER — FENTANYL CITRATE (PF) 100 MCG/2ML IJ SOLN
INTRAMUSCULAR | Status: AC
  Filled 2020-11-03: qty 2

## 2020-11-03 MED ORDER — ACETAMINOPHEN 500 MG PO TABS
ORAL_TABLET | ORAL | Status: AC
  Administered 2020-11-03: 1000 mg via ORAL
  Filled 2020-11-03: qty 2

## 2020-11-03 MED ORDER — ONDANSETRON HCL 4 MG/2ML IJ SOLN: 4.0000 mg | Freq: Once | INTRAMUSCULAR | Status: DC | PRN

## 2020-11-03 MED ORDER — SERTRALINE HCL 100 MG PO TABS
100.0000 mg | ORAL_TABLET | Freq: Every day | ORAL | Status: DC
  Administered 2020-11-04: 100 mg via ORAL
  Filled 2020-11-03: qty 1

## 2020-11-03 MED ORDER — AMPHETAMINE-DEXTROAMPHET ER 5 MG PO CP24
30.0000 mg | ORAL_CAPSULE | ORAL | Status: DC
  Administered 2020-11-04: 30 mg via ORAL
  Filled 2020-11-03: qty 6

## 2020-11-03 MED ORDER — LIDOCAINE HCL (PF) 2 % IJ SOLN
INTRAMUSCULAR | Status: AC
  Filled 2020-11-03: qty 4

## 2020-11-03 MED ORDER — FAMOTIDINE 20 MG PO TABS
ORAL_TABLET | ORAL | Status: AC
  Administered 2020-11-03: 20 mg via ORAL
  Filled 2020-11-03: qty 1

## 2020-11-03 MED ORDER — MIDAZOLAM HCL 2 MG/2ML IJ SOLN
INTRAMUSCULAR | Status: DC | PRN
  Administered 2020-11-03: 2 mg via INTRAVENOUS

## 2020-11-03 MED ORDER — CEFAZOLIN SODIUM-DEXTROSE 2-4 GM/100ML-% IV SOLN
2.0000 g | INTRAVENOUS | Status: AC
  Administered 2020-11-03: 2 g via INTRAVENOUS

## 2020-11-03 MED ORDER — FENTANYL CITRATE (PF) 100 MCG/2ML IJ SOLN
25.0000 ug | INTRAMUSCULAR | Status: DC | PRN
  Administered 2020-11-03 (×3): 25 ug via INTRAVENOUS

## 2020-11-03 MED ORDER — KETOROLAC TROMETHAMINE 30 MG/ML IJ SOLN: 30.0000 mg | Freq: Once | INTRAMUSCULAR | Status: AC

## 2020-11-03 MED ORDER — PROPOFOL 10 MG/ML IV BOLUS
INTRAVENOUS | Status: AC
  Filled 2020-11-03: qty 20

## 2020-11-03 SURGICAL SUPPLY — 68 items
ADH SKN CLS APL DERMABOND .7 (GAUZE/BANDAGES/DRESSINGS) ×1
APL PRP STRL LF DISP 70% ISPRP (MISCELLANEOUS) ×1
BAG DRN RND TRDRP ANRFLXCHMBR (UROLOGICAL SUPPLIES) ×1
BAG URINE DRAIN 2000ML AR STRL (UROLOGICAL SUPPLIES) ×2 IMPLANT
BLADE SURG 15 STRL LF DISP TIS (BLADE) ×1 IMPLANT
BLADE SURG 15 STRL SS (BLADE) ×2
BLADE SURG SZ10 CARB STEEL (BLADE) ×2 IMPLANT
BLADE SURG SZ11 CARB STEEL (BLADE) ×2 IMPLANT
CATH FOLEY 2WAY  5CC 16FR (CATHETERS) ×2
CATH FOLEY 2WAY 5CC 16FR (CATHETERS) ×1
CATH URTH 16FR FL 2W BLN LF (CATHETERS) ×1 IMPLANT
CHLORAPREP W/TINT 26 (MISCELLANEOUS) ×2 IMPLANT
COVER WAND RF STERILE (DRAPES) IMPLANT
DEFOGGER SCOPE WARMER CLEARIFY (MISCELLANEOUS) ×1 IMPLANT
DERMABOND ADVANCED (GAUZE/BANDAGES/DRESSINGS) ×1
DERMABOND ADVANCED .7 DNX12 (GAUZE/BANDAGES/DRESSINGS) ×1 IMPLANT
ELECT REM PT RETURN 9FT ADLT (ELECTROSURGICAL) ×2
ELECTRODE REM PT RTRN 9FT ADLT (ELECTROSURGICAL) ×1 IMPLANT
GAUZE 4X4 16PLY RFD (DISPOSABLE) ×4 IMPLANT
GAUZE PACK 2X3YD (PACKING) ×1 IMPLANT
GLOVE BIOGEL PI ORTHO PRO 7.5 (GLOVE) ×1
GLOVE PI ORTHO PRO STRL 7.5 (GLOVE) ×1 IMPLANT
GLOVE SURG ENC MOIS LTX SZ6.5 (GLOVE) ×2 IMPLANT
GLOVE SURG ENC MOIS LTX SZ8 (GLOVE) ×4 IMPLANT
GLOVE SURG UNDER LTX SZ7 (GLOVE) ×4 IMPLANT
GOWN STRL REUS W/ TWL LRG LVL3 (GOWN DISPOSABLE) ×2 IMPLANT
GOWN STRL REUS W/TWL LRG LVL3 (GOWN DISPOSABLE) ×4
HANDLE YANKAUER SUCT BULB TIP (MISCELLANEOUS) ×2 IMPLANT
IRRIGATION STRYKERFLOW (MISCELLANEOUS) ×1 IMPLANT
IRRIGATOR STRYKERFLOW (MISCELLANEOUS) ×2
IV LACTATED RINGERS 1000ML (IV SOLUTION) ×2 IMPLANT
KIT PINK PAD W/HEAD ARE REST (MISCELLANEOUS) ×2
KIT PINK PAD W/HEAD ARM REST (MISCELLANEOUS) ×1 IMPLANT
KIT TURNOVER CYSTO (KITS) ×2 IMPLANT
LABEL OR SOLS (LABEL) ×2 IMPLANT
LIGASURE LAP MARYLAND 5MM 37CM (ELECTROSURGICAL) ×1 IMPLANT
LIGASURE VESSEL 5MM BLUNT TIP (ELECTROSURGICAL) ×2 IMPLANT
MANIFOLD NEPTUNE II (INSTRUMENTS) ×2 IMPLANT
MANIPULATOR VCARE LG CRV RETR (MISCELLANEOUS) IMPLANT
MANIPULATOR VCARE SML CRV RETR (MISCELLANEOUS) IMPLANT
MANIPULATOR VCARE STD CRV RETR (MISCELLANEOUS) IMPLANT
NDL SPNL 22GX3.5 QUINCKE BK (NEEDLE) ×1 IMPLANT
NEEDLE SPNL 22GX3.5 QUINCKE BK (NEEDLE) ×2 IMPLANT
OCCLUDER COLPOPNEUMO (BALLOONS) IMPLANT
PACK BASIN MINOR ARMC (MISCELLANEOUS) ×2 IMPLANT
PACK GYN LAPAROSCOPIC (MISCELLANEOUS) ×2 IMPLANT
PAD OB MATERNITY 4.3X12.25 (PERSONAL CARE ITEMS) ×2 IMPLANT
RETRACTOR PHONTONGUIDE ADAPT (ADAPTER) ×1 IMPLANT
RETRACTOR YANK SUCT EIGR SABER (INSTRUMENTS) ×1 IMPLANT
SCISSORS METZENBAUM CVD 33 (INSTRUMENTS) IMPLANT
SHEARS HARMONIC ACE PLUS 36CM (ENDOMECHANICALS) IMPLANT
SLEEVE ENDOPATH XCEL 5M (ENDOMECHANICALS) ×4 IMPLANT
STRIP CLOSURE SKIN 1/2X4 (GAUZE/BANDAGES/DRESSINGS) ×2 IMPLANT
SUT MNCRL 4-0 (SUTURE) ×2
SUT MNCRL 4-0 27XMFL (SUTURE) ×1
SUT VIC AB 0 CT1 27 (SUTURE) ×4
SUT VIC AB 0 CT1 27XCR 8 STRN (SUTURE) ×2 IMPLANT
SUT VIC AB 0 CT1 36 (SUTURE) ×3 IMPLANT
SUT VIC AB 0 CT2 27 (SUTURE) ×2 IMPLANT
SUT VIC AB 2-0 CT1 (SUTURE) ×2 IMPLANT
SUT VIC AB 4-0 FS2 27 (SUTURE) ×2 IMPLANT
SUTURE MNCRL 4-0 27XMF (SUTURE) ×1 IMPLANT
SYR 10ML LL (SYRINGE) ×2 IMPLANT
SYR 50ML LL SCALE MARK (SYRINGE) IMPLANT
SYR CONTROL 10ML LL (SYRINGE) ×2 IMPLANT
TAPE TRANSPORE STRL 2 31045 (GAUZE/BANDAGES/DRESSINGS) ×2 IMPLANT
TROCAR XCEL NON-BLD 5MMX100MML (ENDOMECHANICALS) ×2 IMPLANT
TUBING EVAC SMOKE HEATED PNEUM (TUBING) ×2 IMPLANT

## 2020-11-03 NOTE — Discharge Instructions (Signed)
Laparoscopically Assisted Vaginal Hysterectomy, Care After The following information offers guidance on how to care for yourself after your procedure. Your health care provider may also give you more specific instructions. If you have problems or questions, contact your health care provider. What can I expect after the procedure? After the procedure, it is common to have:  Soreness and numbness in your incision areas.  Abdominal pain. You will be given pain medicine to control it.  Vaginal bleeding and discharge. You will need to use a sanitary pad after this procedure.  Tiredness (fatigue).  Poor appetite.  Less interest in sex.  Feelings of sadness or other emotions. If your ovaries were also removed, it is common to have symptoms of menopause, such as hot flashes, night sweats, and lack of sleep (insomnia). Follow these instructions at home: Medicines  Take over-the-counter and prescription medicines only as told by your health care provider.  Do not take aspirin or NSAIDs, such as ibuprofen. These medicines can cause bleeding.  Ask your health care provider if the medicine prescribed to you: ? Requires you to avoid driving or using heavy machinery. ? Can cause constipation. You may need to take these actions to prevent or treat constipation:  Drink enough fluid to keep your urine pale yellow.  Take over-the-counter or prescription medicines.  Eat foods that are high in fiber, such as beans, whole grains, and fresh fruits and vegetables.  Limit foods that are high in fat and processed sugars, such as fried or sweet foods. Incision care  Follow instructions from your health care provider about how to take care of your incisions. Make sure you: ? Wash your hands with soap and water for at least 20 seconds before and after you change your bandage (dressing). If soap and water are not available, use hand sanitizer. ? Change your dressing as told by your health care  provider. ? Leave stitches (sutures), skin glue, or adhesive strips in place. These skin closures may need to stay in place for 2 weeks or longer. If adhesive strip edges start to loosen and curl up, you may trim the loose edges. Do not remove adhesive strips completely unless your health care provider tells you to do that.  Check your incision areas every day for signs of infection. Check for: ? More redness, swelling, or pain. ? Fluid or blood. ? Warmth. ? Pus or a bad smell.   Activity  Rest as told by your healthcare provider.  Return to your normal activities as told by your health care provider. Ask your health care provider what activities are safe for you.  Avoid sitting for a long time without moving. Get up to take short walks every 1-2 hours. This is important to improve blood flow and breathing. Ask for help if you feel weak or unsteady.  Do not lift anything that is heavier than 10 lb (4.5 kg), or the limit that you are told, until your health care provider says that it is safe.  If you were given a sedative during the procedure, it can affect you for several hours. Do not drive or operate machinery until your health care provider says that it is safe.   Lifestyle  Do not use any products that contain nicotine or tobacco. These products include cigarettes, chewing tobacco, and vaping devices, such as e-cigarettes. These can delay healing after surgery. If you need help quitting, ask your health care provider.  Do not drink alcohol until your health care provider approves. General   instructions  Do not douche, use tampons, or have sex for at least 6 weeks, or as told by your health care provider.  If you struggle with physical or emotional changes after your procedure, speak with your health care provider or a therapist.  Do not take baths, swim, or use a hot tub until your health care provider approves. You may only be allowed to take showers for 2-3 weeks.  Keep your  dressing dry until your health care provider says it can be removed.  Try to have someone at home with you for the first 1-2 weeks to help with your daily chores.  Wear compression stockings as told by your health care provider. These stockings help to prevent blood clots and reduce swelling in your legs.  Keep all follow-up visits. This is important.   Contact a health care provider if:  You have any of these signs of infection: ? More redness, swelling, warmth, or pain around an incision. ? Fluid or blood coming from an incision. ? Pus or a bad smell coming from an incision. ? Chills or a fever.  An incision opens.  Your pain medicine is not helping.  You feel dizzy or light-headed.  You have pain or bleeding when you urinate.  You have nausea and vomiting that does not go away.  You have pus, or a bad-smelling discharge coming from your vagina. Get help right away if:  You have a fever and your symptoms suddenly get worse.  You have severe abdominal pain.  You have chest pain.  You have shortness of breath.  You faint.  You have pain, swelling, or redness in your leg.  You have heavy vaginal bleeding and blood clots, soaking through a sanitary pad in less than 1 hour. These symptoms may represent a serious problem that is an emergency. Do not wait to see if the symptoms will go away. Get medical help right away. Call your local emergency services (911 in the [place] Do not drive yourself to the hospital. Summary  After the procedure, it is common to have abdominal pain and vaginal bleeding.  Wear a sanitary pad for vaginal discharge or bleeding.  You should not drive or lift heavy objects until your health care provider says that it is safe.  Contact your health care provider if you have any symptoms of infection, heavy vaginal bleeding, nausea, vomiting, or shortness of breath. This information is not intended to replace advice given to you by your health care  provider. Make sure you discuss any questions you have with your health care provider. Document Revised: 02/01/2020 Document Reviewed: 02/01/2020 Elsevier Patient Education  2021 [employer]  

## 2020-11-03 NOTE — Progress Notes (Signed)
Sent text to husband pt will be going to room 347

## 2020-11-03 NOTE — Anesthesia Procedure Notes (Signed)
Procedure Name: Intubation Date/Time: 11/03/2020 7:37 AM Performed by: Danelle Berry, CRNA Pre-anesthesia Checklist: Patient identified, Emergency Drugs available, Suction available and Patient being monitored Patient Re-evaluated:Patient Re-evaluated prior to induction Oxygen Delivery Method: Circle system utilized Preoxygenation: Pre-oxygenation with 100% oxygen Induction Type: IV induction Ventilation: Mask ventilation without difficulty Laryngoscope Size: McGraph and 3 Tube type: Oral Tube size: 7.0 mm Number of attempts: 1 Airway Equipment and Method: Stylet and Oral airway Placement Confirmation: ETT inserted through vocal cords under direct vision,  positive ETCO2 and breath sounds checked- equal and bilateral Tube secured with: Tape Dental Injury: Teeth and Oropharynx as per pre-operative assessment

## 2020-11-03 NOTE — Anesthesia Preprocedure Evaluation (Signed)
Anesthesia Evaluation  Patient identified by MRN, date of birth, ID band Patient awake    Reviewed: Allergy & Precautions, NPO status , Patient's Chart, lab work & pertinent test results  Airway Mallampati: II  TM Distance: >3 FB     Dental  (+) Teeth Intact   Pulmonary neg pulmonary ROS,    Pulmonary exam normal        Cardiovascular negative cardio ROS Normal cardiovascular exam     Neuro/Psych  Headaches, PSYCHIATRIC DISORDERS Anxiety    GI/Hepatic negative GI ROS, Neg liver ROS,   Endo/Other  negative endocrine ROS  Renal/GU negative Renal ROS  Female GU complaint     Musculoskeletal negative musculoskeletal ROS (+)   Abdominal Normal abdominal exam  (+)   Peds negative pediatric ROS (+)  Hematology negative hematology ROS (+)   Anesthesia Other Findings Past Medical History: No date: ADD (attention deficit disorder) No date: Allergic rhinitis No date: Anxiety No date: Endometriosis No date: Headache(784.0) No date: Other acne No date: Other and unspecified ovarian cyst No date: Personal history of urinary calculi  Reproductive/Obstetrics                             Anesthesia Physical Anesthesia Plan  ASA: II  Anesthesia Plan: General   Post-op Pain Management:    Induction: Intravenous  PONV Risk Score and Plan:   Airway Management Planned: Oral ETT  Additional Equipment:   Intra-op Plan:   Post-operative Plan: Extubation in OR  Informed Consent: I have reviewed the patients History and Physical, chart, labs and discussed the procedure including the risks, benefits and alternatives for the proposed anesthesia with the patient or authorized representative who has indicated his/her understanding and acceptance.     Dental advisory given  Plan Discussed with: CRNA and Surgeon  Anesthesia Plan Comments:         Anesthesia Quick Evaluation

## 2020-11-03 NOTE — Transfer of Care (Signed)
Immediate Anesthesia Transfer of Care Note  Patient: [name]  Procedure(s) Performed: LAPAROSCOPIC ASSISTED VAGINAL HYSTERECTOMY WITH SALPINGECTOMY (Bilateral )  Patient Location: PACU  Anesthesia Type:General  Level of Consciousness: sedated  Airway & Oxygen Therapy: Patient Spontanous Breathing and Patient connected to face mask oxygen  Post-op Assessment: Report given to RN and Post -op Vital signs reviewed and stable  Post vital signs: Reviewed and stable  Last Vitals:  Vitals Value Taken Time  BP 137/90 11/03/20 1016  Temp 36.4 C 11/03/20 1011  Pulse 81 11/03/20 1016  Resp 20 11/03/20 1016  SpO2 100 % 11/03/20 1016    Last Pain:  Vitals:   11/03/20 1011  TempSrc:   PainSc: Asleep         Complications: No complications documented.

## 2020-11-03 NOTE — H&P (Signed)
GYNECOLOGY PREOPERATIVE HISTORY AND PHYSICAL   Subjective:  [name] is a 35 y.o. [id number] here for surgical management of endometriosis.  [name] is currently on continuous OCPs for management.  Notes that prior to March, her last flare of endometriosis was ~ 5 years ago. Is now currently noting a heavy prolonged menstrual cycle (started cycle March 10th still ongoing).  Additionally she is experiencing sciatic right leg pain (which often occurs with her flares of endometriosis).  Also noting intermittent abdominal and pelvic pain with bloating.   [name] has been dealing with endometriosis since age 59. Has prior history of use of pills, Depot Provera, Lupron, and IUD for management in the past. She has completed childbearing and now desires definitive surgical management.  No significant preoperative concerns.  Proposed surgery: Laparoscopic-assisted vaginal hysterectomy (LAVH) with bilateral salpingectomy     Pertinent Gynecological History: Patient's last menstrual period was 08/21/2020.  Contraception: OCP (estrogen/progesterone) Last pap: normal Date: 03/07/2019.    Menstrual History:  Menarche age: 54 Patient's last menstrual period was 08/21/2020. Period Duration (Days): 26 Period Pattern: (!) Irregular Menstrual Flow: Heavy,Moderate,Light Menstrual Control: Tampon Menstrual Control Change Freq (Hours): 3 Dysmenorrhea: (!) Moderate Dysmenorrhea Symptoms: Cramping   Past Medical History:  Diagnosis Date  . ADD (attention deficit disorder)   . Allergic rhinitis   . Anxiety   . Endometriosis   . Headache(784.0)   . Other acne   . Other and unspecified ovarian cyst   . Personal history of urinary calculi     Past Surgical History:  Procedure Laterality Date  . APPENDECTOMY  3/07  . DILATION AND CURETTAGE OF UTERUS  4/10   miscarriage  . LAPAROSCOPY  2006   pelvic--endometriosis  . MOLE REMOVAL     ? atypical    OB History  Gravida Para Term  Preterm AB Living  3 2 2   1 2   SAB IAB Ectopic Multiple Live Births  1       2    # Outcome Date GA Lbr Len/2nd Weight Sex Delivery Anes PTL Lv  3 Term 03/29/14   3232 g M Vag-Spont   LIV  2 Term 09/10/09   3289 g F Vag-Spont   LIV  1 SAB 2010            Family History  Problem Relation Age of Onset  . Colon polyps Father   . Other Father        Brain tumor  . Depression Mother     Social History   Socioeconomic History  . Marital status: Married    Spouse name: Not on file  . Number of children: 1  . Years of education: Not on file  . Highest education level: Not on file  Occupational History  . Occupation: 2011  Tobacco Use  . Smoking status: Never Smoker  . Smokeless tobacco: Never Used  Vaping Use  . Vaping Use: Never used  Substance and Sexual Activity  . Alcohol use: Yes    Alcohol/week: 3.0 standard drinks    Types: 3 Glasses of wine per week  . Drug use: No  . Sexual activity: Yes    Birth control/protection: Pill  Other Topics Concern  . Not on file  Social History Narrative   Married (Husband works nights--police work)      1 daughter (2011)      06-28-1986   Social Determinants of Nurse, learning disability Strain: Not on file  Food Insecurity: Not on file  Transportation Needs: Not on file  Physical Activity: Not on file  Stress: Not on file  Social Connections: Not on file  Intimate Partner Violence: Not on file    No current facility-administered medications on file prior to encounter.   Current Outpatient Medications on File Prior to Encounter  Medication Sig Dispense Refill  . ADDERALL XR 30 MG 24 hr capsule Take 30 mg by mouth every morning.    Marland Kitchen amphetamine-dextroamphetamine (ADDERALL) 10 MG tablet Take 10 mg by mouth daily. In the afternoon    . cetirizine (ZYRTEC) 10 MG tablet TAKE 1 TABLET BY MOUTH EVERY DAY (Patient taking differently: Take 10 mg by mouth daily.) 30 tablet 3  . levonorgestrel-ethinyl  estradiol (SEASONALE) 0.15-0.03 MG tablet Take 1 tablet by mouth daily. 91 tablet 4  . Multiple Vitamins-Minerals (MULTIVITAMIN WITH MINERALS) tablet Take 1 tablet by mouth daily.     Allergies  Allergen Reactions  . Fexofenadine-Pseudoephed Er     Sudafed  palpatations, felt allegra stopped working  . Pseudoephedrine Palpitations    REACTION: reaction not known     Review of Systems Constitutional: No recent fever/chills/sweats Respiratory: No recent cough/bronchitis Cardiovascular: No chest pain Gastrointestinal: No recent nausea/vomiting/diarrhea Genitourinary: No UTI symptoms Hematologic/lymphatic:No history of coagulopathy or recent blood thinner use    Objective:   Blood pressure 106/72, pulse 73, temperature 98.3 F (36.8 C), temperature source Temporal, resp. rate 16, SpO2 98 %. CONSTITUTIONAL: Well-developed, well-nourished female in no acute distress.  HENT:  Normocephalic, atraumatic, External right and left ear normal. Oropharynx is clear and moist EYES: Conjunctivae and EOM are normal. Pupils are equal, round, and reactive to light. No scleral icterus.  NECK: Normal range of motion, supple, no masses SKIN: Skin is warm and dry. No rash noted. Not diaphoretic. No erythema. No pallor. NEUROLOGIC: Alert and oriented to person, place, and time. Normal reflexes, muscle tone coordination. No cranial nerve deficit noted. PSYCHIATRIC: Normal mood and affect. Normal behavior. Normal judgment and thought content. CARDIOVASCULAR: Normal heart rate noted, regular rhythm RESPIRATORY: Effort and breath sounds normal, no problems with respiration noted ABDOMEN: Soft, nontender, nondistended. PELVIC: Deferred MUSCULOSKELETAL: Normal range of motion. No edema and no tenderness. 2+ distal pulses.    Labs: Results for orders placed or performed during the hospital encounter of 11/03/20 (from the past 336 hour(s))  Pregnancy, urine POC   Collection Time: 11/03/20  6:17 AM  Result  Value Ref Range   Preg Test, Ur NEGATIVE NEGATIVE  ABO/Rh   Collection Time: 11/03/20  6:36 AM  Result Value Ref Range   ABO/RH(D) PENDING   Results for orders placed or performed during the hospital encounter of 10/30/20 (from the past 336 hour(s))  SARS CORONAVIRUS 2 (TAT 6-24 HRS) Nasopharyngeal Nasopharyngeal Swab   Collection Time: 10/30/20  8:53 AM   Specimen: Nasopharyngeal Swab  Result Value Ref Range   SARS Coronavirus 2 NEGATIVE NEGATIVE  Hepatitis C antibody   Collection Time: 10/30/20  8:54 AM  Result Value Ref Range   HCV Ab NON REACTIVE NON REACTIVE  Hepatitis B surface antibody,quantitative   Collection Time: 10/30/20  8:54 AM  Result Value Ref Range   Hepatitis B-Post 66.6 Immunity>9.9 mIU/mL  CBC   Collection Time: 10/30/20  8:54 AM  Result Value Ref Range   WBC 7.7 4.0 - 10.5 K/uL   RBC 4.71 3.87 - 5.11 MIL/uL   Hemoglobin 15.0 12.0 - 15.0 g/dL   HCT 69.6 29.5 - 28.4 %  MCV 93.0 80.0 - 100.0 fL   MCH 31.8 26.0 - 34.0 pg   MCHC 34.2 30.0 - 36.0 g/dL   RDW 19.6 22.2 - 97.9 %   Platelets 222 150 - 400 K/uL   nRBC 0.0 0.0 - 0.2 %  Comprehensive metabolic panel   Collection Time: 10/30/20  8:54 AM  Result Value Ref Range   Sodium 137 135 - 145 mmol/L   Potassium 4.0 3.5 - 5.1 mmol/L   Chloride 104 98 - 111 mmol/L   CO2 23 22 - 32 mmol/L   Glucose, Bld 76 70 - 99 mg/dL   BUN 9 6 - 20 mg/dL   Creatinine, Ser 8.92 0.44 - 1.00 mg/dL   Calcium 8.9 8.9 - 11.9 mg/dL   Total Protein 7.9 6.5 - 8.1 g/dL   Albumin 4.2 3.5 - 5.0 g/dL   AST 15 15 - 41 U/L   ALT 16 0 - 44 U/L   Alkaline Phosphatase 48 38 - 126 U/L   Total Bilirubin 0.5 0.3 - 1.2 mg/dL   GFR, Estimated >41 >74 mL/min   Anion gap 10 5 - 15  Rapid HIV screen (HIV 1/2 Ab+Ag)   Collection Time: 10/30/20  8:54 AM  Result Value Ref Range   HIV-1 P24 Antigen - HIV24 NON REACTIVE NON REACTIVE   HIV 1/2 Antibodies NON REACTIVE NON REACTIVE   Interpretation (HIV Ag Ab)      A non reactive test result  means that HIV 1 or HIV 2 antibodies and HIV 1 p24 antigen were not detected in the specimen.  RPR   Collection Time: 10/30/20  8:54 AM  Result Value Ref Range   RPR Ser Ql NON REACTIVE NON REACTIVE  Type and screen   Collection Time: 10/30/20  8:54 AM  Result Value Ref Range   ABO/RH(D) O NEG    Antibody Screen POS    Sample Expiration 11/13/2020,2359    Extend sample reason NO TRANSFUSIONS OR PREGNANCY IN THE PAST 3 MONTHS    Antibody Identification ANTI E    PT AG Type      POSITIVE FOR c ANTIGEN Performed at [facility], [address] [place], [place] [address]    Unit Number [id number]    Blood Component Type RED CELLS,LR    Unit division 00    Status of Unit ALLOCATED    Transfusion Status OK TO TRANSFUSE    Crossmatch Result COMPATIBLE    Unit Number [id number]    Blood Component Type RED CELLS,LR    Unit division 00    Status of Unit ALLOCATED    Transfusion Status OK TO TRANSFUSE    Crossmatch Result COMPATIBLE   BPAM RBC   Collection Time: 10/30/20  8:54 AM  Result Value Ref Range   Blood Product Unit Number [id number]    PRODUCT CODE E0382V00    Unit Type and Rh 9500    Blood Product Expiration Date 096283662947    Blood Product Unit Number [id number]    PRODUCT CODE L2751Z00    Unit Type and Rh 9500    Blood Product Expiration Date 174944967591      Imaging Studies: No results found.  Assessment:  Endometriosis Prolonged menses  Sciatic pain right leg  Plan:   Counseling: Procedure, risks, reasons, benefits and complications (including injury to bowel, bladder, major blood vessel, ureter, bleeding, possibility of transfusion, infection, or fistula formation) reviewed in detail. Likelihood of success in alleviating the patient's condition have been discussed. Routine  postoperative instructions will be reviewed with the patient and her family in detail after surgery.  The patient concurred with the proposed plan, giving informed  written consent for the surgery.   Preop testing reviewed. Patient has been NPO since midnight.   Hildred Laserherry, Zavian Slowey, MD [facility]

## 2020-11-03 NOTE — Op Note (Addendum)
Procedure(s): LAPAROSCOPIC ASSISTED VAGINAL HYSTERECTOMY WITH BILATERAL SALPINGECTOMY Procedure Note  [name] female 35 y.o. 11/03/2020  Indications: The patient is a 35 y.o. [id number] female with a history of endometriosis, pelvic pain, prolonged menses desiring definitive management with hysterectomy. Has been on continuous OCPs x 5 years.  Has also tried Depot Provera, Depot Lupron, and IUD for management in the past.   Pre-operative Diagnosis: Endometriosis, pelvic pain, prolonged menses  Post-operative Diagnosis: Same  Surgeon: Hildred Laser, MD  Assistants:  Brennan Bailey, MD. An experienced assistant was required given the standard of surgical care given the complexity of the case.  This assistant was needed for exposure, dissection, suctioning, retraction, instrument exchange, and for overall help during the procedure.  Anesthesia: General endotracheal anesthesia  Findings: Small uterus , normal adnexa bilaterally. No abdominal/pelvic abnormality.  Normal upper abdomen.  No implants of endometriosis seen in abdomen or pelvis.   Procedure Details: The patient was seen in the Holding Room. The risks, benefits, complications, treatment options, and expected outcomes were discussed with the patient.  The patient concurred with the proposed plan, giving informed consent.  The site of surgery properly noted/marked. The patient was taken to the Operating Room, identified as [name] and the procedure verified as Procedure(s) (LRB): LAPAROSCOPIC ASSISTED VAGINAL HYSTERECTOMY WITH SALPINGECTOMY (Bilateral). A Time Out was held and the above information confirmed.  The patient received intravenous antibiotics and had sequential compression devices applied to her lower extremities while in the preoperative area.   After induction of anesthesia, the patient was placed in dosal lithotomy position using Allen stirrups, and was prepped and draped in the usual sterile manner. A Foley  catheter was placed and attached to constant drainage.  A Hulka clamp was then inserted into the uterus to be used for uterine manipulation.  After an adequate timeout was performed, attention was turned to the abdomen where an umbilical incision was made with the scalpel.  The Optiview 5-mm trocar and sleeve were then advanced without difficulty with the laparoscope under direct visualization into the abdomen.  The abdomen was then insufflated with carbon dioxide gas and adequate pneumoperitoneum was obtained.  Bilateral 5-mm lower quadrant ports were then placed under direct visualization.  A survey of the patient's pelvis and abdomen revealed the findings as above.  Attention was turned to the mesosalpinx on the patient's right side, which was clamped using the Ligasure scalpel and ligated. The broad ligament was also ligated with the Harmonic scalpel working towards the round ligament.  The round and the utero-ovarian ligaments were then clamped and transected with the Harmonic scalpel.  The ureter were noted to be safely away from the area of dissection.  The uterine artery was then skeletonized and a bladder flap was created.  The bladder was then bluntly dissected off the lower uterine segment.  At this point, attention was turned to the right uterine vessels, which were clamped and ligated using the Harmonic scalpel.  Attention was then turned to the patient's left side, which was treated in a similar manner by taking the mesosalpinx, the utero-ovarian ligament, the round ligament and the broad ligaments and the bladder flap creation was completed. The left uterine vessels were also clamped and transected in a similar fashion. A final survey of the abdomen was performed. No intraoperative injury to other surrounding organs was noted.  Good hemostasis was noted. The abdomen was desufflated and all instruments were then removed from the patient's abdomen.   All skin incisions were  closed with 4-0 Monocryl, and  covered with Dermabond. The incisions were injected with a total of 17 ml of 0.5% Marcaine. The decision was made to proceed with completing the hysterectomy via the vaginal route .  A weighted speculum was placed into the vagina and the cervix was grasped with Lahey clamps.  The cervix was then circumferentially injected with 20 cc of Pitressin solution (20 ml of  1:10,000 epinephrine diluted in 50 ml of normal saline).  The cervix was then circumferentially incised with the scalpel.  The posterior cul-de sac was then entered sharply with the Mayo scissors without difficulty.  The same procedure was performed anteriorly, and the bladder dissected of the pubovesical cervical fascia.    At this point, a Heaney clamp was placed over the uterosacaral ligaments on either side.  These were then transected and suture ligated with #0 Vicryl.  Hemostasis was assured.  The cardinal ligaments were then clamped on both sides, transected and suture ligated in a similar fashion.    The uterine arteries and the broad ligament were then serially clamped with Heaney clamps, transected and suture ligated on both sides until the uterus was freed.  The edge of the vaginal cuff was noted to be oozing on the left, and several figure-of-eight sutures were used to achieve hemostasis. The vaginal cuff was then anchored to the uterosacral ligaments using the modified Richardson's stitch with a 0-Vicryl. The vagina was reperitonealized using a circumferential purse-string suture of 0-Vicryl.  The remainder of the vaginal cuff was closed with figure-of-eight stitches of #0 Vicryl. Hemostasis was achieved.  All instruments were then removed from the vagina.  The foley catheter was removed.   The patient was then taken out of dorsal lithotomy position and awakened from general anesthesia.  The patient was taken to the PACU in stable condition.  Sponge, lap, needle, and instrument count was correct times two.     Estimated Blood Loss:   150 ml      Drains: foley catheterization prior to procedure with  700 ml of clear urine         Total IV Fluids:  1000 ml  Specimens:  Uterus, cervix, bilateral ovaries and fallopian tubes.         Implants: None         Complications:  None; patient tolerated the procedure well.         Disposition: PACU - hemodynamically stable.         Condition: stable   Hildred Laser, MD [facility]

## 2020-11-04 ENCOUNTER — Encounter: Payer: Self-pay | Admitting: Obstetrics and Gynecology

## 2020-11-04 ENCOUNTER — Telehealth: Payer: Self-pay | Admitting: Obstetrics and Gynecology

## 2020-11-04 DIAGNOSIS — N809 Endometriosis, unspecified: Secondary | ICD-10-CM | POA: Diagnosis not present

## 2020-11-04 DIAGNOSIS — R87616 Satisfactory cervical smear but lacking transformation zone: Secondary | ICD-10-CM | POA: Diagnosis not present

## 2020-11-04 DIAGNOSIS — N800 Endometriosis of uterus: Secondary | ICD-10-CM | POA: Diagnosis not present

## 2020-11-04 DIAGNOSIS — N841 Polyp of cervix uteri: Secondary | ICD-10-CM | POA: Diagnosis not present

## 2020-11-04 DIAGNOSIS — N920 Excessive and frequent menstruation with regular cycle: Secondary | ICD-10-CM | POA: Diagnosis not present

## 2020-11-04 LAB — TYPE AND SCREEN
ABO/RH(D): O NEG
Antibody Screen: POSITIVE
PT AG Type: POSITIVE
Unit division: 0
Unit division: 0

## 2020-11-04 LAB — BPAM RBC
Blood Product Expiration Date: 202206222359
Blood Product Expiration Date: 202206222359
Unit Type and Rh: 9500
Unit Type and Rh: 9500

## 2020-11-04 LAB — CBC
HCT: 35.9 % — ABNORMAL LOW (ref 36.0–46.0)
Hemoglobin: 12.1 g/dL (ref 12.0–15.0)
MCH: 31.8 pg (ref 26.0–34.0)
MCHC: 33.7 g/dL (ref 30.0–36.0)
MCV: 94.5 fL (ref 80.0–100.0)
Platelets: 190 10*3/uL (ref 150–400)
RBC: 3.8 MIL/uL — ABNORMAL LOW (ref 3.87–5.11)
RDW: 12 % (ref 11.5–15.5)
WBC: 7.9 10*3/uL (ref 4.0–10.5)
nRBC: 0 % (ref 0.0–0.2)

## 2020-11-04 LAB — CREATININE, SERUM
Creatinine, Ser: 0.69 mg/dL (ref 0.44–1.00)
GFR, Estimated: >60 mL/min (ref >60)

## 2020-11-04 MED ORDER — OXYCODONE HCL 5 MG PO TABS: 5.0000 mg | ORAL_TABLET | Freq: Four times a day (QID) | ORAL | 0 refills | Status: AC | PRN

## 2020-11-04 MED ORDER — ACETAMINOPHEN 500 MG PO TABS: 1000.0000 mg | ORAL_TABLET | Freq: Four times a day (QID) | ORAL | 0 refills | Status: DC | PRN

## 2020-11-04 MED ORDER — DOCUSATE SODIUM 100 MG PO CAPS: 100.0000 mg | ORAL_CAPSULE | Freq: Two times a day (BID) | ORAL | 2 refills | Status: DC | PRN

## 2020-11-04 MED ORDER — IBUPROFEN 600 MG PO TABS: 600.0000 mg | ORAL_TABLET | Freq: Four times a day (QID) | ORAL | 0 refills | Status: DC

## 2020-11-04 NOTE — Telephone Encounter (Signed)
Spoke to pt concerning her call to the office. Pt was asking if she had a blood transfusion after/during her surgery. Pt stated that she seen some information about  BPAM in her mychart notes.

## 2020-11-04 NOTE — Telephone Encounter (Signed)
No, she did not receive a blood transfusion during her surgery. She did not lose very much blood during her surgery. We order a type and screen (to check blood types) on any patient having major surgery in case of an emergency for if they need blood. Her blood type was showing that she had some antibodies in it (likely exposed at the time of pregnancy if she has never had a blood transfusion before). These antibodies undergo further testing to make sure that in the event of emergency she would not have a reaction to blood in case it was needed.

## 2020-11-04 NOTE — Telephone Encounter (Signed)
Pt called asking if she had a blood transfusion during/after her surgery. She seen BPAM in her mychart notes and was wondering if she got a blood transfusion. Please advise. Thanks [employer]

## 2020-11-04 NOTE — Anesthesia Postprocedure Evaluation (Signed)
Anesthesia Post Note  Patient: [name]  Procedure(s) Performed: LAPAROSCOPIC ASSISTED VAGINAL HYSTERECTOMY WITH SALPINGECTOMY (Bilateral )  Patient location during evaluation: PACU Anesthesia Type: General Level of consciousness: awake and alert and oriented Pain management: pain level controlled Vital Signs Assessment: post-procedure vital signs reviewed and stable Respiratory status: spontaneous breathing Cardiovascular status: blood pressure returned to baseline Anesthetic complications: no   No complications documented.   Last Vitals:  Vitals:   11/04/20 0417 11/04/20 0744  BP: 99/61 105/72  Pulse: 76 66  Resp: 20   Temp: 36.6 C 36.4 C  SpO2: 98% 99%    Last Pain:  Vitals:   11/04/20 1128  TempSrc:   PainSc: 0-No pain                 Abdulai Blaylock

## 2020-11-04 NOTE — Progress Notes (Signed)
Post-Operative Day #1, s/p LAVH with bilateral salpingectomy.  Subjective: no complaints, up ad lib and tolerating PO.  Has not yet voided, catheter was recently removed. Pain controlled with pain medications.   Objective: Temp:  [97 F (36.1 C)-98 F (36.7 C)] 97.6 F (36.4 C) (05/24 0744) Pulse Rate:  [63-92] 66 (05/24 0744) Resp:  [14-20] 20 (05/24 0417) BP: (99-142)/(58-93) 105/72 (05/24 0744) SpO2:  [94 %-100 %] 99 % (05/24 0744)  Physical Exam:  General: alert and no distress  Lungs: clear to auscultation bilaterally Heart: regular rate and rhythm, S1, S2 normal, no murmur, click, rub or gallop Abdomen: soft, non-tender; bowel sounds normal; no masses,  no organomegaly Pelvis:Bleeding: appropriate,  Incision: healing well, no significant drainage, no dehiscence, no significant erythema Extremities: DVT Evaluation: No evidence of DVT seen on physical exam. Negative Homan's sign. No cords or calf tenderness. No significant calf/ankle edema.   CBC Latest Ref Rng & Units 11/04/2020 03/07/2019  WBC 4.0 - 10.5 K/uL 7.9 6.6  Hemoglobin 12.0 - 15.0 g/dL 84.1 32.4  Hematocrit 40.1 - 46.0 % 35.9(L) 42.9  Platelets 150 - 400 K/uL 190 224     Lab Results  Component Value Date   CREATININE 0.69 11/04/2020   CREATININE 0.73 10/30/2020      Assessment/Plan: Doing well postoperatively. Advance diet as tolerated Encourage ambulation and use of spirometer Continue PO pain management Discharge patient today.      Hildred Laser, MD [facility]

## 2020-11-04 NOTE — Progress Notes (Signed)
Patient discharged home with husband. Discharge instructions and prescriptions given and reviewed with patient. Patient verbalized understanding. Will be escorted out by auxillary.  

## 2020-11-04 NOTE — Telephone Encounter (Signed)
Patient called requesting a call back - pt Is inquiring if she received a blood transfusion during her surgery yesterday- pt stated that she saw something gon her mychart about blood transfusion and wanted to clarify. Please Advise.

## 2020-11-05 NOTE — Telephone Encounter (Addendum)
Spoke to pt concerning her call to the office and informed her of the information given by [facility]. Below is the information given by [facility] to provide to the pt:  No, she did not receive a blood transfusion during her surgery. She did not lose very much blood during her surgery. We order a type and screen (to check blood types) on any patient having major surgery in case of an emergency for if they need blood. Her blood type was showing that she had some antibodies in it (likely exposed at the time of pregnancy if she has never had a blood transfusion before). These antibodies undergo further testing to make sure that in the event of emergency she would not have a reaction to blood in case it was needed.

## 2020-11-05 NOTE — Discharge Summary (Signed)
Gynecology Physician Postoperative Discharge Summary  Patient ID: [name] MRN: [id number] DOB/AGE: [birth year] 35 y.o.  Admit Date: 11/03/2020 Discharge Date: 11/05/2020  Preoperative Diagnoses: History of endometriosis, pelvic pain, prolonged menses.  Procedures: Procedure(s) (LRB): LAPAROSCOPIC ASSISTED VAGINAL HYSTERECTOMY WITH SALPINGECTOMY (Bilateral)  Hospital Course:  [name] is a 35 y.o. [id number]  admitted for scheduled surgery.  She underwent the procedures as mentioned above, her operation was uncomplicated. For further details about surgery, please refer to the operative report. Patient had an uncomplicated postoperative course. By time of discharge on POD#1, her pain was controlled on oral pain medications; she was ambulating, voiding without difficulty, tolerating regular diet and passing flatus. She was deemed stable for discharge to home.   Significant Labs: CBC Latest Ref Rng & Units 11/04/2020 10/30/2020 03/07/2019  WBC 4.0 - 10.5 K/uL 7.9 7.7 6.6  Hemoglobin 12.0 - 15.0 g/dL 51.7 61.6 07.3  Hematocrit 36.0 - 46.0 % 35.9(L) 43.8 42.9  Platelets 150 - 400 K/uL 190 222 224    Discharge Exam: Blood pressure 105/72, pulse 66, temperature 97.6 F (36.4 C), temperature source Oral, resp. rate 20, SpO2 99 %. General appearance: alert and no distress  Resp: clear to auscultation bilaterally  Cardio: regular rate and rhythm  GI: soft, non-tender; bowel sounds normal; no masses, no organomegaly.  Incision: C/D/I, no erythema, no drainage noted Pelvic: scant blood on pad  Extremities: extremities normal, atraumatic, no cyanosis or edema and Homans sign is negative, no sign of DVT  Discharged Condition: Stable  Disposition: Home    Allergies as of 11/04/2020      Reactions   Fexofenadine-pseudoephed Er    Sudafed  palpatations, felt allegra stopped working   Pseudoephedrine Palpitations   REACTION: reaction not known      Medication List    STOP taking  these medications   levonorgestrel-ethinyl estradiol 0.15-0.03 MG tablet Commonly known as: SEASONALE     TAKE these medications   acetaminophen 500 MG tablet Commonly known as: TYLENOL Take 2 tablets (1,000 mg total) by mouth every 6 (six) hours as needed.   amphetamine-dextroamphetamine 10 MG tablet Commonly known as: ADDERALL Take 10 mg by mouth daily. In the afternoon   Adderall XR 30 MG 24 hr capsule Generic drug: amphetamine-dextroamphetamine Take 30 mg by mouth every morning.   cetirizine 10 MG tablet Commonly known as: ZYRTEC TAKE 1 TABLET BY MOUTH EVERY DAY   docusate sodium 100 MG capsule Commonly known as: COLACE Take 1 capsule (100 mg total) by mouth 2 (two) times daily as needed.   ibuprofen 600 MG tablet Commonly known as: ADVIL Take 1 tablet (600 mg total) by mouth every 6 (six) hours.   multivitamin with minerals tablet Take 1 tablet by mouth daily.   oxyCODONE 5 MG immediate release tablet Commonly known as: Oxy IR/ROXICODONE Take 1-2 tablets (5-10 mg total) by mouth every 6 (six) hours as needed for up to 5 days for moderate pain.   sertraline 100 MG tablet Commonly known as: ZOLOFT TAKE 1 TABLET BY MOUTH EVERY DAY      Future Appointments  Date Time Provider Department Center  11/12/2020  2:45 PM Hildred Laser, MD [facility] None  12/17/2020  2:45 PM Hildred Laser, MD [facility] None    Follow-up Information    Hildred Laser, MD. Go on 11/12/2020.   Specialties: Obstetrics and Gynecology, Radiology Why: 2:45 pm For wound re-check  6 weeks for post-op visit 12/17/2020 @ 2:45 pm Contact information: [address] [place] [address] [phone]               Total discharge time: 15 minutes   Signed:   Hildred Laser, MD [facility]

## 2020-11-06 LAB — SURGICAL PATHOLOGY

## 2020-11-12 ENCOUNTER — Ambulatory Visit (INDEPENDENT_AMBULATORY_CARE_PROVIDER_SITE_OTHER): Payer: BC Managed Care – PPO | Admitting: Obstetrics and Gynecology

## 2020-11-12 ENCOUNTER — Encounter: Payer: Self-pay | Admitting: Obstetrics and Gynecology

## 2020-11-12 ENCOUNTER — Other Ambulatory Visit: Payer: Self-pay

## 2020-11-12 VITALS — BP 114/82 | HR 98 | Ht 63.0 in | Wt 178.8 lb

## 2020-11-12 DIAGNOSIS — L231 Allergic contact dermatitis due to adhesives: Secondary | ICD-10-CM

## 2020-11-12 DIAGNOSIS — Z4889 Encounter for other specified surgical aftercare: Secondary | ICD-10-CM

## 2020-11-12 DIAGNOSIS — Z90710 Acquired absence of both cervix and uterus: Secondary | ICD-10-CM

## 2020-11-12 DIAGNOSIS — Z8742 Personal history of other diseases of the female genital tract: Secondary | ICD-10-CM

## 2020-11-12 DIAGNOSIS — N800 Endometriosis of uterus: Secondary | ICD-10-CM

## 2020-11-12 DIAGNOSIS — N8003 Adenomyosis of the uterus: Secondary | ICD-10-CM

## 2020-11-12 NOTE — Progress Notes (Signed)
    OBSTETRICS/GYNECOLOGY POST-OPERATIVE CLINIC VISIT  Subjective:     [name] is a 35 y.o. female who presents to the clinic 1 weeks status post laparoscopic assisted vaginal hysterectomy for abnormal uterine bleeding, pelvic pain and history of endometriosis. Eating a regular diet without difficulty. Bowel movements are normal. The patient is not having any pain.  She does report redness around her abdomen, occurred 2-3 days ago. Is itchy. Denies fevers or chills.   The following portions of the patient's history were reviewed and updated as appropriate: allergies, current medications, past family history, past medical history, past social history, past surgical history and problem list.  Review of Systems Pertinent items noted in HPI and remainder of comprehensive ROS otherwise negative.    Objective:    BP 114/82   Pulse 98   Ht 5\' 3"  (1.6 m)   Wt 178 lb 12.8 oz (81.1 kg)   LMP 08/21/2020   BMI 31.67 kg/m  General:  alert and no distress  Abdomen: soft, bowel sounds active, non-tender.  Small papular rash on lower abdomen near umbilicus and around incision sites.   Incision:   healing well, no drainage, no erythema, no hernia, no seroma, no swelling, no dehiscence, incision well approximated    Pathology:   A. UTERUS WITH CERVIX AND BILATERAL FALLOPIAN TUBES; TOTAL HYSTERECTOMY  WITH BILATERAL SALPINGECTOMY:  - UTERINE CERVIX:    - BENIGN ENDOCERVICAL POLYP.    - BENIGN TRANSFORMATION ZONE.    - NEGATIVE FOR SQUAMOUS INTRAEPITHELIAL LESION AND MALIGNANCY.  - ENDOMETRIUM:    - WEAKLY PROLIFERATIVE ENDOMETRIUM.    - NEGATIVE FOR ATYPICAL HYPERPLASIA/EIN AND MALIGNANCY.  - MYOMETRIUM:    - ADENOMYOSIS.    - NEGATIVE FOR FEATURES OF MALIGNANCY.  - FALLOPIAN TUBES:    - NO SIGNIFICANT HISTOPATHOLOGIC CHANGE.   Assessment:    S/p laparoscopic-assisted vaginal hysterectomy  Contact dermatitis  History of endometriosis Adenomyosis Plan:    1. Doing well postoperatively. 2. Wound care discussed.  Advised on use of topical OTC hydrocortisone cream to area with contact dermatitis.  3. Operative findings again reviewed. Pathology report discussed. 4. Activity restrictions: no bending, stooping, or squatting, no lifting more than 10-15 pounds and pelvic rest x 5 weeks 5. Anticipated return to work: 5 weeks. 6. Follow up: 5 weeks for final postoperative visit     10/21/2020, MD [facility]

## 2020-11-12 NOTE — Progress Notes (Signed)
Pt present for wound check. Pt stated that she has one incision on the left that has a red area around the entire incision. Other incision pt stated looks good.

## 2020-12-17 ENCOUNTER — Ambulatory Visit (INDEPENDENT_AMBULATORY_CARE_PROVIDER_SITE_OTHER): Payer: BC Managed Care – PPO | Admitting: Obstetrics and Gynecology

## 2020-12-17 ENCOUNTER — Other Ambulatory Visit: Payer: Self-pay

## 2020-12-17 ENCOUNTER — Encounter: Payer: Self-pay | Admitting: Obstetrics and Gynecology

## 2020-12-17 VITALS — BP 119/80 | HR 98 | Ht 63.0 in | Wt 180.8 lb

## 2020-12-17 DIAGNOSIS — Z8742 Personal history of other diseases of the female genital tract: Secondary | ICD-10-CM

## 2020-12-17 DIAGNOSIS — Z90710 Acquired absence of both cervix and uterus: Secondary | ICD-10-CM

## 2020-12-17 DIAGNOSIS — Z4889 Encounter for other specified surgical aftercare: Secondary | ICD-10-CM

## 2020-12-17 DIAGNOSIS — N8003 Adenomyosis of the uterus: Secondary | ICD-10-CM

## 2020-12-17 DIAGNOSIS — N800 Endometriosis of uterus: Secondary | ICD-10-CM

## 2020-12-17 NOTE — Progress Notes (Signed)
    OBSTETRICS/GYNECOLOGY POST-OPERATIVE CLINIC VISIT  Subjective:     [name] is a 35 y.o. female who presents to the clinic 6 weeks status post laparoscopic assisted vaginal hysterectomy for abnormal uterine bleeding, pelvic pain and history of endometriosis . Eating a regular diet without difficulty. Bowel movements are normal. The patient is not having any pain.      The following portions of the patient's history were reviewed and updated as appropriate: allergies, current medications, past family history, past medical history, past social history, past surgical history and problem list.  Review of Systems Pertinent items noted in HPI and remainder of comprehensive ROS otherwise negative.    Objective:    BP 119/80   Pulse 98   Ht 5\' 3"  (1.6 m)   Wt 180 lb 12.8 oz (82 kg)   LMP 08/21/2020   BMI 32.03 kg/m  General:  alert and no distress  Abdomen: soft, bowel sounds active, non-tender.  Small papular rash on lower abdomen near umbilicus and around incision sites.   Incision:   Well-healed, no drainage, no erythema, no hernia, no seroma, no swelling, no dehiscence, incision well approximated  Pelvis: Well-healed vaginal cuff, except small area of granulation tissue at vaginal cuff. No discharge. No suture material present.     Pathology:   A. UTERUS WITH CERVIX AND BILATERAL FALLOPIAN TUBES; TOTAL HYSTERECTOMY  WITH BILATERAL SALPINGECTOMY:  - UTERINE CERVIX:       - BENIGN ENDOCERVICAL POLYP.       - BENIGN TRANSFORMATION ZONE.       - NEGATIVE FOR SQUAMOUS INTRAEPITHELIAL LESION AND MALIGNANCY.  - ENDOMETRIUM:       - WEAKLY PROLIFERATIVE ENDOMETRIUM.       - NEGATIVE FOR ATYPICAL HYPERPLASIA/EIN AND MALIGNANCY.  - MYOMETRIUM:       - ADENOMYOSIS.       - NEGATIVE FOR FEATURES OF MALIGNANCY.  - FALLOPIAN TUBES:       - NO SIGNIFICANT HISTOPATHOLOGIC CHANGE.   Assessment:   Post-operative visit S/p laparoscopic-assisted vaginal hysterectomy History of  endometriosis Adenomyosis Plan:   1. Doing well postoperatively. 2. Operative findings previously reviewed. Pathology report discussed. 3. Activity restrictions: none 4. Anticipated return to work: now. 5. Will continue to monitor for symptoms of endometriosis as patient still retains ovaries. May require ovarian suppression or removal in the future if signficant symptoms arise.  6. Follow up: in April for annual exam, or sooner as needed.     May, MD [facility]

## 2020-12-17 NOTE — Progress Notes (Signed)
Pt present for post op follow up after having hysterectomy. Pt stated that she was doing well.

## 2021-01-06 ENCOUNTER — Other Ambulatory Visit: Payer: Self-pay | Admitting: Family Medicine

## 2021-01-06 NOTE — Telephone Encounter (Signed)
Pt hasn't been seen in over a year. Please schedule CPE (labs prior) or at least a f/u, once appt has been scheduled please route back to me to fill med

## 2021-01-09 NOTE — Telephone Encounter (Signed)
Med refilled once  

## 2021-01-09 NOTE — Telephone Encounter (Signed)
Pt is scheduled for cpe on 8/30 and labs on 8/23

## 2021-02-02 ENCOUNTER — Telehealth: Payer: Self-pay | Admitting: Family Medicine

## 2021-02-02 DIAGNOSIS — Z0000 Encounter for general adult medical examination without abnormal findings: Secondary | ICD-10-CM

## 2021-02-02 NOTE — Telephone Encounter (Signed)
-----   Message from Aquilla Solian, RT sent at 01/19/2021  9:54 AM EDT ----- Regarding: Lab Orders for Tuesday 8.23.2022 Please place lab orders for Tuesday 8.23.2022, office visit for physical on Tuesday 8.30.2022 Thank you, Jones Bales RT(R)

## 2021-02-03 ENCOUNTER — Other Ambulatory Visit (INDEPENDENT_AMBULATORY_CARE_PROVIDER_SITE_OTHER): Payer: BC Managed Care – PPO

## 2021-02-03 ENCOUNTER — Other Ambulatory Visit: Payer: Self-pay

## 2021-02-03 DIAGNOSIS — Z0000 Encounter for general adult medical examination without abnormal findings: Secondary | ICD-10-CM | POA: Diagnosis not present

## 2021-02-03 LAB — LIPID PANEL
Cholesterol: 145 mg/dL (ref 0–200)
HDL: 66.5 mg/dL (ref >39.00)
LDL Cholesterol: 73 mg/dL (ref 0–99)
NonHDL: 78.6
Total CHOL/HDL Ratio: 2
Triglycerides: 29 mg/dL (ref 0.0–149.0)
VLDL: 5.8 mg/dL (ref 0.0–40.0)

## 2021-02-03 LAB — CBC WITH DIFFERENTIAL/PLATELET
Basophils Absolute: 0 10*3/uL (ref 0.0–0.1)
Basophils Relative: 0.9 % (ref 0.0–3.0)
Eosinophils Absolute: 0.1 10*3/uL (ref 0.0–0.7)
Eosinophils Relative: 2 % (ref 0.0–5.0)
HCT: 39.4 % (ref 36.0–46.0)
Hemoglobin: 13.5 g/dL (ref 12.0–15.0)
Lymphocytes Relative: 37.8 % (ref 12.0–46.0)
Lymphs Abs: 1.9 10*3/uL (ref 0.7–4.0)
MCHC: 34.3 g/dL (ref 30.0–36.0)
MCV: 92.7 fl (ref 78.0–100.0)
Monocytes Absolute: 0.3 10*3/uL (ref 0.1–1.0)
Monocytes Relative: 5.5 % (ref 3.0–12.0)
Neutro Abs: 2.7 10*3/uL (ref 1.4–7.7)
Neutrophils Relative %: 53.8 % (ref 43.0–77.0)
Platelets: 187 10*3/uL (ref 150.0–400.0)
RBC: 4.25 Mil/uL (ref 3.87–5.11)
RDW: 12.6 % (ref 11.5–15.5)
WBC: 4.9 10*3/uL (ref 4.0–10.5)

## 2021-02-03 LAB — COMPREHENSIVE METABOLIC PANEL
ALT: 15 U/L (ref 0–35)
AST: 13 U/L (ref 0–37)
Albumin: 4.1 g/dL (ref 3.5–5.2)
Alkaline Phosphatase: 51 U/L (ref 39–117)
BUN: 15 mg/dL (ref 6–23)
CO2: 27 mEq/L (ref 19–32)
Calcium: 8.9 mg/dL (ref 8.4–10.5)
Chloride: 105 mEq/L (ref 96–112)
Creatinine, Ser: 0.86 mg/dL (ref 0.40–1.20)
GFR: 87.41 mL/min (ref >60.00)
Glucose, Bld: 89 mg/dL (ref 70–99)
Potassium: 4.1 mEq/L (ref 3.5–5.1)
Sodium: 139 mEq/L (ref 135–145)
Total Bilirubin: 0.5 mg/dL (ref 0.2–1.2)
Total Protein: 6.5 g/dL (ref 6.0–8.3)

## 2021-02-03 LAB — TSH: TSH: 1.65 u[IU]/mL (ref 0.35–5.50)

## 2021-02-10 ENCOUNTER — Encounter: Payer: Self-pay | Admitting: Family Medicine

## 2021-02-10 ENCOUNTER — Ambulatory Visit (INDEPENDENT_AMBULATORY_CARE_PROVIDER_SITE_OTHER): Payer: BC Managed Care – PPO | Admitting: Family Medicine

## 2021-02-10 ENCOUNTER — Other Ambulatory Visit: Payer: Self-pay

## 2021-02-10 VITALS — BP 118/70 | HR 65 | Temp 98.0°F | Ht 63.25 in | Wt 176.4 lb

## 2021-02-10 DIAGNOSIS — F411 Generalized anxiety disorder: Secondary | ICD-10-CM

## 2021-02-10 DIAGNOSIS — Z90710 Acquired absence of both cervix and uterus: Secondary | ICD-10-CM

## 2021-02-10 DIAGNOSIS — Z0000 Encounter for general adult medical examination without abnormal findings: Secondary | ICD-10-CM | POA: Diagnosis not present

## 2021-02-10 MED ORDER — SERTRALINE HCL 100 MG PO TABS: 100.0000 mg | ORAL_TABLET | Freq: Every day | ORAL | 3 refills | Status: DC

## 2021-02-10 NOTE — Assessment & Plan Note (Signed)
Reviewed health habits including diet and exercise and skin cancer prevention Reviewed appropriate screening tests for age  Also reviewed health mt list, fam hx and immunization status , as well as social and family history   See HPI Labs reviewed Had covid in [place], not immunized  Pap utd, recent hysterectomy for endometriosis  Mood is positive on sertraline  Good cholestero profile with ratio of 2 Encouraged regular exercise  Declines flu vaccine  Encouraged self breast exams and gyn f/u

## 2021-02-10 NOTE — Assessment & Plan Note (Signed)
Stable as long as she takes sertraline 100 mg daily at times overwhelmed Reviewed stressors/ coping techniques/symptoms/ support sources/ tx options and side effects in detail today Stress should improve now that hysterectomy is done Enc good self care and return to exercise when able

## 2021-02-10 NOTE — Patient Instructions (Addendum)
Labs look good   Try to get most of your carbohydrates from produce (with the exception of white potatoes)  Eat less bread/pasta/rice/snack foods/cereals/sweets and other items from the middle of the grocery store (processed carbs)  Exercise regularly   Use sun protection

## 2021-02-10 NOTE — Assessment & Plan Note (Signed)
Doing well s/p partial hysterectomy  Some hot flashes early on but now better

## 2021-02-10 NOTE — Progress Notes (Signed)
Subjective:    Patient ID: [name], female    DOB: [birth year], 35 y.o.   MRN: [id number]  This visit occurred during the SARS-CoV-2 public health emergency.  Safety protocols were in place, including screening questions prior to the visit, additional usage of staff PPE, and extensive cleaning of exam room while observing appropriate contact time as indicated for disinfecting solutions.   HPI Here for health maintenance exam and to review chronic medical problems   Wt Readings from Last 3 Encounters:  02/10/21 176 lb 6 oz (80 kg)  12/17/20 180 lb 12.8 oz (82 kg)  11/12/20 178 lb 12.8 oz (81.1 kg)   31.00 kg/m Had to take higher dose hormones before hysterectomy  Now able to loose weight   Starting to walk now   Flu shot- declines  Tdap 8/17  Covid status - had had in January   Pap 9/20  Sees gyn Just had a hysterectomy (partial) for endometriosis  Doing well  Also some polyps /fibroids  Laparoscopic  Had hot flashes briefly and now back to being cold natured    BP Readings from Last 3 Encounters:  02/10/21 118/70  12/17/20 119/80  11/12/20 114/82   Pulse Readings from Last 3 Encounters:  02/10/21 65  12/17/20 98  11/12/20 98   Mood/ GAD Sertraline 100 mg daily  Thinks she is doing pretty good -esp in light of surgery  Gets overwhelmed from time to time   Stress is up and down - moderate   Cholesterol Lab Results  Component Value Date   CHOL 145 02/03/2021   CHOL 151 03/07/2019   CHOL 141 02/17/2017   Lab Results  Component Value Date   HDL 66.50 02/03/2021   HDL 85 03/07/2019   HDL 82 02/17/2017   Lab Results  Component Value Date   LDLCALC 73 02/03/2021   LDLCALC 57 03/07/2019   LDLCALC 53 02/17/2017   Lab Results  Component Value Date   TRIG 29.0 02/03/2021   TRIG 34 03/07/2019   TRIG 31 02/17/2017   Lab Results  Component Value Date   CHOLHDL 2 02/03/2021   CHOLHDL 1.8 03/07/2019   CHOLHDL 1.7 02/17/2017   No results found  for: LDLDIRECT  Other labs Results for orders placed or performed in visit on 02/03/21  TSH  Result Value Ref Range   TSH 1.65 0.35 - 5.50 uIU/mL  Lipid panel  Result Value Ref Range   Cholesterol 145 0 - 200 mg/dL   Triglycerides 73.4 0.0 - 149.0 mg/dL   HDL 19.37 >90.24 mg/dL   VLDL 5.8 0.0 - 09.7 mg/dL   LDL Cholesterol 73 0 - 99 mg/dL   Total CHOL/HDL Ratio 2    NonHDL 78.60   CBC with Differential/Platelet  Result Value Ref Range   WBC 4.9 4.0 - 10.5 K/uL   RBC 4.25 3.87 - 5.11 Mil/uL   Hemoglobin 13.5 12.0 - 15.0 g/dL   HCT 35.3 29.9 - 24.2 %   MCV 92.7 78.0 - 100.0 fl   MCHC 34.3 30.0 - 36.0 g/dL   RDW 68.3 41.9 - 62.2 %   Platelets 187.0 150.0 - 400.0 K/uL   Neutrophils Relative % 53.8 43.0 - 77.0 %   Lymphocytes Relative 37.8 12.0 - 46.0 %   Monocytes Relative 5.5 3.0 - 12.0 %   Eosinophils Relative 2.0 0.0 - 5.0 %   Basophils Relative 0.9 0.0 - 3.0 %   Neutro Abs 2.7 1.4 - 7.7 K/uL  Lymphs Abs 1.9 0.7 - 4.0 K/uL   Monocytes Absolute 0.3 0.1 - 1.0 K/uL   Eosinophils Absolute 0.1 0.0 - 0.7 K/uL   Basophils Absolute 0.0 0.0 - 0.1 K/uL  Comprehensive metabolic panel  Result Value Ref Range   Sodium 139 135 - 145 mEq/L   Potassium 4.1 3.5 - 5.1 mEq/L   Chloride 105 96 - 112 mEq/L   CO2 27 19 - 32 mEq/L   Glucose, Bld 89 70 - 99 mg/dL   BUN 15 6 - 23 mg/dL   Creatinine, Ser 1.610.86 0.40 - 1.20 mg/dL   Total Bilirubin 0.5 0.2 - 1.2 mg/dL   Alkaline Phosphatase 51 39 - 117 U/L   AST 13 0 - 37 U/L   ALT 15 0 - 35 U/L   Total Protein 6.5 6.0 - 8.3 g/dL   Albumin 4.1 3.5 - 5.2 g/dL   GFR 09.6087.41 >45.40>60.00 mL/min   Calcium 8.9 8.4 - 10.5 mg/dL    . Patient Active Problem List   Diagnosis Date Noted   Adenomyosis 11/12/2020   History of endometriosis 11/12/2020   S/P laparoscopic assisted vaginal hysterectomy (LAVH) 11/03/2020   GAD (generalized anxiety disorder) 09/05/2019   Routine general medical examination at a health care facility 01/26/2016   Attention  deficit disorder 11/24/2006   ALLERGIC RHINITIS 11/21/2006   OVARIAN CYST 11/21/2006   NEPHROLITHIASIS, HX OF 11/21/2006   Past Medical History:  Diagnosis Date   ADD (attention deficit disorder)    Allergic rhinitis    Anxiety    Endometriosis    Headache(784.0)    Other acne    Other and unspecified ovarian cyst    Personal history of urinary calculi    Past Surgical History:  Procedure Laterality Date   APPENDECTOMY  3/07   DILATION AND CURETTAGE OF UTERUS  4/10   miscarriage   LAPAROSCOPIC VAGINAL HYSTERECTOMY WITH SALPINGECTOMY Bilateral 11/03/2020   Procedure: LAPAROSCOPIC ASSISTED VAGINAL HYSTERECTOMY WITH SALPINGECTOMY;  Surgeon: Hildred Laserherry, Anika, MD;  Location: [facility] ORS;  Service: Gynecology;  Laterality: Bilateral;   LAPAROSCOPY  2006   pelvic--endometriosis   MOLE REMOVAL     ? atypical   Social History   Tobacco Use   Smoking status: Never   Smokeless tobacco: Never  Vaping Use   Vaping Use: Never used  Substance Use Topics   Alcohol use: Not Currently    Alcohol/week: 3.0 standard drinks    Types: 3 Glasses of wine per week   Drug use: No   Family History  Problem Relation Age of Onset   Colon polyps Father    Other Father        Brain tumor   Depression Mother    Allergies  Allergen Reactions   Fexofenadine-Pseudoephed Er     Sudafed  palpatations, felt allegra stopped working   Pseudoephedrine Palpitations    REACTION: reaction not known   Current Outpatient Medications on File Prior to Visit  Medication Sig Dispense Refill   ADDERALL XR 30 MG 24 hr capsule Take 30 mg by mouth every morning.     amphetamine-dextroamphetamine (ADDERALL) 10 MG tablet Take 10 mg by mouth daily. In the afternoon     cetirizine (ZYRTEC) 10 MG tablet TAKE 1 TABLET BY MOUTH EVERY DAY (Patient taking differently: Take 10 mg by mouth daily.) 30 tablet 3   Multiple Vitamins-Minerals (MULTIVITAMIN WITH MINERALS) tablet Take 1 tablet by mouth daily.     No current  facility-administered medications on file prior to visit.  Review of Systems  Constitutional:  Positive for fatigue. Negative for activity change, appetite change, fever and unexpected weight change.  HENT:  Negative for congestion, ear pain, rhinorrhea, sinus pressure and sore throat.   Eyes:  Negative for pain, redness and visual disturbance.  Respiratory:  Negative for cough, shortness of breath and wheezing.   Cardiovascular:  Negative for chest pain and palpitations.  Gastrointestinal:  Negative for abdominal pain, blood in stool, constipation and diarrhea.  Endocrine: Negative for polydipsia and polyuria.  Genitourinary:  Negative for dysuria, frequency and urgency.  Musculoskeletal:  Negative for arthralgias, back pain and myalgias.  Skin:  Negative for pallor and rash.  Allergic/Immunologic: Negative for environmental allergies.  Neurological:  Negative for dizziness, syncope and headaches.  Hematological:  Negative for adenopathy. Does not bruise/bleed easily.  Psychiatric/Behavioral:  Negative for decreased concentration and dysphoric mood. The patient is not nervous/anxious.       Objective:   Physical Exam Constitutional:      General: She is not in acute distress.    Appearance: Normal appearance. She is well-developed. She is obese. She is not ill-appearing or diaphoretic.  HENT:     Head: Normocephalic and atraumatic.     Right Ear: Tympanic membrane, ear canal and external ear normal.     Left Ear: Tympanic membrane, ear canal and external ear normal.     Nose: Nose normal. No congestion.     Mouth/Throat:     Mouth: Mucous membranes are moist.     Pharynx: Oropharynx is clear. No posterior oropharyngeal erythema.  Eyes:     General: No scleral icterus.    Extraocular Movements: Extraocular movements intact.     Conjunctiva/sclera: Conjunctivae normal.     Pupils: Pupils are equal, round, and reactive to light.  Neck:     Thyroid: No thyromegaly.     Vascular:  No carotid bruit or JVD.  Cardiovascular:     Rate and Rhythm: Normal rate and regular rhythm.     Pulses: Normal pulses.     Heart sounds: Normal heart sounds.    No gallop.  Pulmonary:     Effort: Pulmonary effort is normal. No respiratory distress.     Breath sounds: Normal breath sounds. No wheezing.     Comments: Good air exch Chest:     Chest wall: No tenderness.  Abdominal:     General: Bowel sounds are normal. There is no distension or abdominal bruit.     Palpations: Abdomen is soft. There is no mass.     Tenderness: There is no abdominal tenderness.     Hernia: No hernia is present.     Comments: Healed incisions from lab hysterectomy No tenderness  Genitourinary:    Comments: Breast and pelvic exam done by gyn provider Musculoskeletal:        General: No tenderness. Normal range of motion.     Cervical back: Normal range of motion and neck supple. No rigidity. No muscular tenderness.     Right lower leg: No edema.     Left lower leg: No edema.  Lymphadenopathy:     Cervical: No cervical adenopathy.  Skin:    General: Skin is warm and dry.     Coloration: Skin is not pale.     Findings: No erythema or rash.  Neurological:     Mental Status: She is alert. Mental status is at baseline.     Cranial Nerves: No cranial nerve deficit.     Motor: No  abnormal muscle tone.     Coordination: Coordination normal.     Gait: Gait normal.     Deep Tendon Reflexes: Reflexes are normal and symmetric. Reflexes normal.  Psychiatric:        Mood and Affect: Mood normal.        Cognition and Memory: Cognition and memory normal.          Assessment & Plan:   Problem List Items Addressed This Visit       Other   Routine general medical examination at a health care facility - Primary    Reviewed health habits including diet and exercise and skin cancer prevention Reviewed appropriate screening tests for age  Also reviewed health mt list, fam hx and immunization status , as  well as social and family history   See HPI Labs reviewed Had covid in [place], not immunized  Pap utd, recent hysterectomy for endometriosis  Mood is positive on sertraline  Good cholestero profile with ratio of 2 Encouraged regular exercise  Declines flu vaccine  Encouraged self breast exams and gyn f/u       GAD (generalized anxiety disorder)    Stable as long as she takes sertraline 100 mg daily at times overwhelmed Reviewed stressors/ coping techniques/symptoms/ support sources/ tx options and side effects in detail today Stress should improve now that hysterectomy is done Enc good self care and return to exercise when able      Relevant Medications   sertraline (ZOLOFT) 100 MG tablet   S/P laparoscopic assisted vaginal hysterectomy (LAVH)    Doing well s/p partial hysterectomy  Some hot flashes early on but now better

## 2021-07-22 ENCOUNTER — Ambulatory Visit: Payer: BC Managed Care – PPO | Admitting: Podiatry

## 2021-07-27 ENCOUNTER — Encounter: Payer: Self-pay | Admitting: Podiatry

## 2021-07-27 ENCOUNTER — Ambulatory Visit (INDEPENDENT_AMBULATORY_CARE_PROVIDER_SITE_OTHER): Payer: BC Managed Care – PPO

## 2021-07-27 ENCOUNTER — Other Ambulatory Visit: Payer: Self-pay

## 2021-07-27 ENCOUNTER — Ambulatory Visit: Payer: BC Managed Care – PPO | Admitting: Podiatry

## 2021-07-27 DIAGNOSIS — M722 Plantar fascial fibromatosis: Secondary | ICD-10-CM

## 2021-07-27 MED ORDER — MELOXICAM 15 MG PO TABS: 15.0000 mg | ORAL_TABLET | Freq: Every day | ORAL | 3 refills | Status: DC

## 2021-07-27 MED ORDER — METHYLPREDNISOLONE 4 MG PO TBPK: ORAL_TABLET | ORAL | 0 refills | Status: DC

## 2021-07-27 MED ORDER — TRIAMCINOLONE ACETONIDE 40 MG/ML IJ SUSP
20.0000 mg | Freq: Once | INTRAMUSCULAR | Status: AC
  Administered 2021-07-27: 20 mg

## 2021-07-27 NOTE — Patient Instructions (Signed)

## 2021-07-27 NOTE — Progress Notes (Signed)
Subjective:  Patient ID: [name], female    DOB: 01-28-1986,  MRN: [id number] HPI Chief Complaint  Patient presents with   Foot Pain    Plantar heel left - aching x 4 months, AM pain, now all day, tried Ibuprofen, ice, rolling with ball-no help   New Patient (Initial Visit)    36 y.o. female presents with the above complaint.   ROS: Denies fever chills nausea vomiting muscle aches pains calf pain back pain chest pain shortness of breath.  Past Medical History:  Diagnosis Date   ADD (attention deficit disorder)    Allergic rhinitis    Anxiety    Endometriosis    Headache(784.0)    Other acne    Other and unspecified ovarian cyst    Personal history of urinary calculi    Past Surgical History:  Procedure Laterality Date   APPENDECTOMY  3/07   DILATION AND CURETTAGE OF UTERUS  4/10   miscarriage   LAPAROSCOPIC VAGINAL HYSTERECTOMY WITH SALPINGECTOMY Bilateral 11/03/2020   Procedure: LAPAROSCOPIC ASSISTED VAGINAL HYSTERECTOMY WITH SALPINGECTOMY;  Surgeon: Hildred Laser, MD;  Location: [facility] ORS;  Service: Gynecology;  Laterality: Bilateral;   LAPAROSCOPY  2006   pelvic--endometriosis   MOLE REMOVAL     ? atypical    Current Outpatient Medications:    Amphetamine ER (ADZENYS XR-ODT) 12.5 MG TBED, , Disp: , Rfl:    meloxicam (MOBIC) 15 MG tablet, Take 1 tablet (15 mg total) by mouth daily., Disp: 30 tablet, Rfl: 3   methylPREDNISolone (MEDROL DOSEPAK) 4 MG TBPK tablet, 6 day dose pack - take as directed, Disp: 21 tablet, Rfl: 0   cetirizine (ZYRTEC) 10 MG tablet, TAKE 1 TABLET BY MOUTH EVERY DAY (Patient taking differently: Take 10 mg by mouth daily.), Disp: 30 tablet, Rfl: 3   Multiple Vitamins-Minerals (MULTIVITAMIN WITH MINERALS) tablet, Take 1 tablet by mouth daily., Disp: , Rfl:    sertraline (ZOLOFT) 100 MG tablet, Take 1 tablet (100 mg total) by mouth daily., Disp: 90 tablet, Rfl: 3  Allergies  Allergen Reactions   Fexofenadine-Pseudoephed Er     Sudafed   palpatations, felt allegra stopped working   Pseudoephedrine Palpitations    REACTION: reaction not known   Review of Systems Objective:  There were no vitals filed for this visit.  General: Well developed, nourished, in no acute distress, alert and oriented x3   Dermatological: Skin is warm, dry and supple bilateral. Nails x 10 are well maintained; remaining integument appears unremarkable at this time. There are no open sores, no preulcerative lesions, no rash or signs of infection present.  Vascular: Dorsalis Pedis artery and Posterior Tibial artery pedal pulses are 2/4 bilateral with immedate capillary fill time. Pedal hair growth present. No varicosities and no lower extremity edema present bilateral.   Neruologic: Grossly intact via light touch bilateral. Vibratory intact via tuning fork bilateral. Protective threshold with Semmes Wienstein monofilament intact to all pedal sites bilateral. Patellar and Achilles deep tendon reflexes 2+ bilateral. No Babinski or clonus noted bilateral.   Musculoskeletal: No gross boney pedal deformities bilateral. No pain, crepitus, or limitation noted with foot and ankle range of motion bilateral. Muscular strength 5/5 in all groups tested bilateral.  Pain on palpation medial calcaneal tubercle and plantar central calcaneal tubercle left foot.  Gait: Unassisted, Nonantalgic.    Radiographs:  Radiographs taken today demonstrate small plantar distal urinary containing heel spur soft tissue increase in density plantar fascial cannula insertion site otherwise no acute findings.  Assessment &  Plan:   Assessment: Planter fasciitis left.  Plan: We discussed the etiology pathology conservative versus surgical therapies.  At this point I injected her left heel 20 mg Kenalog 5 mg Marcaine at the point of maximal tenderness.  She tolerated procedure well without complications.  Started her on a Medrol Dosepak to be followed by meloxicam.  Placed on plantar  fascia brace and a night splint discussed appropriate shoe gear stretching exercises and ice therapy.     Kendle Erker T. [place], [place]

## 2021-08-24 ENCOUNTER — Encounter: Payer: Self-pay | Admitting: Podiatry

## 2021-08-24 ENCOUNTER — Ambulatory Visit: Payer: BC Managed Care – PPO | Admitting: Podiatry

## 2021-08-24 ENCOUNTER — Other Ambulatory Visit: Payer: Self-pay

## 2021-08-24 DIAGNOSIS — M722 Plantar fascial fibromatosis: Secondary | ICD-10-CM

## 2021-08-24 MED ORDER — TRIAMCINOLONE ACETONIDE 40 MG/ML IJ SUSP
20.0000 mg | Freq: Once | INTRAMUSCULAR | Status: AC
  Administered 2021-08-24: 20 mg

## 2021-08-24 NOTE — Progress Notes (Signed)
She presents today for follow-up of her Planter fasciitis to her left heel.  She states that it has been doing really well at about 75% improved and not hurting every day but has recently over the past week started to become a little more noticeable again.  She continues her plantar fascia brace her good tennis shoes and her meloxicam. ? ?Objective: Vital signs are stable she is alert and oriented x3 there is no erythema edema cellulitis drainage odor much decrease in thickness and in temperature from previous evaluation.  She still has some tenderness on palpation at the medial calcaneal tubercle at the plantar fascial calcaneal insertion site. ? ?Assessment: Resolving Planter fasciitis 75%. ? ?Plan: Reinjected the area today 10 mg Kenalog 5 mg Marcaine point maximal tenderness.  Tolerated procedure well without complications we discussed pros and cons of the injection therapies and I will follow-up with her in 1 month I did request that she continue the plantar fascia brace good tennis shoes and her medication. ?

## 2021-09-23 ENCOUNTER — Ambulatory Visit: Payer: BC Managed Care – PPO | Admitting: Podiatry

## 2021-11-29 ENCOUNTER — Encounter (INDEPENDENT_AMBULATORY_CARE_PROVIDER_SITE_OTHER): Payer: Self-pay

## 2021-11-29 ENCOUNTER — Encounter: Payer: Self-pay | Admitting: Emergency Medicine

## 2021-11-29 ENCOUNTER — Telehealth: Payer: BC Managed Care – PPO | Admitting: Emergency Medicine

## 2021-11-29 DIAGNOSIS — J208 Acute bronchitis due to other specified organisms: Secondary | ICD-10-CM | POA: Diagnosis not present

## 2021-11-29 DIAGNOSIS — B9689 Other specified bacterial agents as the cause of diseases classified elsewhere: Secondary | ICD-10-CM | POA: Diagnosis not present

## 2021-11-29 MED ORDER — BENZONATATE 100 MG PO CAPS: 100.0000 mg | ORAL_CAPSULE | Freq: Two times a day (BID) | ORAL | 0 refills | Status: DC | PRN

## 2021-11-29 MED ORDER — AZITHROMYCIN 250 MG PO TABS: ORAL_TABLET | ORAL | 0 refills | Status: AC

## 2021-11-29 NOTE — Progress Notes (Signed)
I have spent 5 minutes in review of e-visit questionnaire, review and updating patient chart, medical decision making and response to patient.   Leigha Olberding, PA-C    

## 2021-11-29 NOTE — Progress Notes (Signed)

## 2022-02-28 ENCOUNTER — Telehealth: Payer: BC Managed Care – PPO | Admitting: Nurse Practitioner

## 2022-02-28 DIAGNOSIS — T63461A Toxic effect of venom of wasps, accidental (unintentional), initial encounter: Secondary | ICD-10-CM

## 2022-02-28 MED ORDER — PREDNISONE 20 MG PO TABS: 40.0000 mg | ORAL_TABLET | Freq: Every day | ORAL | 0 refills | Status: AC

## 2022-02-28 NOTE — Progress Notes (Signed)
E-Visit for Insect Sting  Thank you for describing the insect sting for us.  Here is how we plan to help!  A sting that we will treat with a short course of prednisone.  The 2 greatest risks from insect stings are allergic reaction, which can be fatal in some people and infection, which is more common and less serious.  Bees, wasps, yellow jackets, and hornets belong to a class of insects called Hymenoptera.  Most insect stings cause only minor discomfort.  Stings can happen anywhere on the body and can be painful.  Most stings are from honey bees or yellow jackets.  Fire ants can sting multiple times.  The sites of the stings are more likely to become infected.    I have sent in prednisone 40 mg by mouth daily for 5 days to the pharmacy you selected.  Please make sure that you selected a pharmacy that is open now.  What can be used to prevent Insect Stings?  Insect repellant with at least 20% DEET.  Wearing long pants and shirts with socks and shoes.  Wear dark or drab-colored clothes rather than bright colors.  Avoid using perfumes and hair sprays; these attract insects.  HOME CARE ADVICE:  1. Stinger removal: The stinger looks like a tiny black dot in the sting. Use a fingernail, credit card edge, or knife-edge to scrape it off.  Don't pull it out because it squeezes out more venom. If the stinger is below the skin surface, leave it alone.  It will be shed with normal skin healing. 2. Use cold compresses to the area of the sting for 10-20 minutes.  You may repeat this as needed to relieve symptoms of pain and swelling. 3.  For pain relief, take acetominophen 650 mg 4-6 hours as needed or ibuprofen 400 mg every 6-8 hours as needed or naproxen 250-500 mg every 12 hours as needed. 4.  You can also use hydrocortisone cream 0.5% or 1% up to 4 times daily as needed for itching. 5.  If the sting becomes very itchy, take Benadryl 25-50 mg, follow directions on box. 6.  Wash the area 2-3  times daily with antibacterial soap and warm water. 7. Call your Doctor if: Fever, a severe headache, or rash occur in the next 2 weeks. Sting area begins to look infected. Redness and swelling worsens after home treatment. Your current symptoms become worse.    MAKE SURE YOU:  Understand these instructions. Will watch your condition. Will get help right away if you are not doing well or get worse.  Thank you for choosing an e-visit.  Your e-visit answers were reviewed by a board certified advanced clinical practitioner to complete your personal care plan. Depending upon the condition, your plan could have included both over the counter or prescription medications.  Please review your pharmacy choice. Make sure the pharmacy is open so you can pick up prescription now. If there is a problem, you may contact your provider through MyChart messaging and have the prescription routed to another pharmacy.  Your safety is important to us. If you have drug allergies check your prescription carefully.   For the next 24 hours you can use MyChart to ask questions about today's visit, request a non-urgent call back, or ask for a work or school excuse. You will get an email in the next two days asking about your experience. I hope that your e-visit has been valuable and will speed your recovery.  

## 2022-02-28 NOTE — Progress Notes (Signed)
I have spent 5 minutes in review of e-visit questionnaire, review and updating patient chart, medical decision making and response to patient.  ° °Marabelle Cushman W Jacinta Penalver, NP ° °  °

## 2022-04-05 ENCOUNTER — Other Ambulatory Visit: Payer: Self-pay | Admitting: Family Medicine

## 2022-04-05 DIAGNOSIS — F411 Generalized anxiety disorder: Secondary | ICD-10-CM

## 2022-04-06 NOTE — Telephone Encounter (Signed)
Pt hasn't been seen in over a year, please schedule CPE (labs prior if possible) and then route back to me to refill

## 2022-04-07 ENCOUNTER — Telehealth: Payer: Self-pay | Admitting: Family Medicine

## 2022-04-07 NOTE — Telephone Encounter (Signed)
  Encourage patient to contact the pharmacy for refills or they can request refills through [facility]  Did the patient contact the pharmacy:  no   LAST APPOINTMENT DATE: 02/10/21  NEXT APPOINTMENT DATE:04/14/22  MEDICATION:sertraline (ZOLOFT) 100 MG tablet  Is the patient out of medication? yes  If not, how much is left?  Is this a 90 day supply: yes  PHARMACY: [id number] [address], [place] Phone:  [phone]  Fax:  [phone]      Let patient know to contact pharmacy at the end of the day to make sure medication is ready.  Please notify patient to allow 48-72 hours to process

## 2022-04-07 NOTE — Telephone Encounter (Signed)
See refill encounter

## 2022-04-07 NOTE — Telephone Encounter (Signed)
Spoke to pt, cpe scheduled on 04/14/22. Pt requested same day labs

## 2022-04-14 ENCOUNTER — Telehealth: Payer: Self-pay

## 2022-04-14 ENCOUNTER — Encounter: Payer: Self-pay | Admitting: Family Medicine

## 2022-04-14 ENCOUNTER — Ambulatory Visit (INDEPENDENT_AMBULATORY_CARE_PROVIDER_SITE_OTHER): Payer: BC Managed Care – PPO | Admitting: Family Medicine

## 2022-04-14 ENCOUNTER — Other Ambulatory Visit: Payer: Self-pay

## 2022-04-14 VITALS — BP 110/66 | HR 72 | Temp 97.4°F | Ht 63.0 in | Wt 167.1 lb

## 2022-04-14 DIAGNOSIS — Z83719 Family history of colon polyps, unspecified: Secondary | ICD-10-CM

## 2022-04-14 DIAGNOSIS — Z1211 Encounter for screening for malignant neoplasm of colon: Secondary | ICD-10-CM | POA: Diagnosis not present

## 2022-04-14 DIAGNOSIS — F411 Generalized anxiety disorder: Secondary | ICD-10-CM

## 2022-04-14 DIAGNOSIS — Z0000 Encounter for general adult medical examination without abnormal findings: Secondary | ICD-10-CM

## 2022-04-14 DIAGNOSIS — F988 Other specified behavioral and emotional disorders with onset usually occurring in childhood and adolescence: Secondary | ICD-10-CM

## 2022-04-14 LAB — LIPID PANEL
Cholesterol: 176 mg/dL (ref 0–200)
HDL: 93.3 mg/dL (ref >39.00)
LDL Cholesterol: 76 mg/dL (ref 0–99)
NonHDL: 82.92
Total CHOL/HDL Ratio: 2
Triglycerides: 36 mg/dL (ref 0.0–149.0)
VLDL: 7.2 mg/dL (ref 0.0–40.0)

## 2022-04-14 LAB — COMPREHENSIVE METABOLIC PANEL
ALT: 16 U/L (ref 0–35)
AST: 17 U/L (ref 0–37)
Albumin: 4.7 g/dL (ref 3.5–5.2)
Alkaline Phosphatase: 71 U/L (ref 39–117)
BUN: 15 mg/dL (ref 6–23)
CO2: 30 mEq/L (ref 19–32)
Calcium: 9.5 mg/dL (ref 8.4–10.5)
Chloride: 102 mEq/L (ref 96–112)
Creatinine, Ser: 0.86 mg/dL (ref 0.40–1.20)
GFR: 86.68 mL/min (ref >60.00)
Glucose, Bld: 76 mg/dL (ref 70–99)
Potassium: 4.4 mEq/L (ref 3.5–5.1)
Sodium: 139 mEq/L (ref 135–145)
Total Bilirubin: 0.5 mg/dL (ref 0.2–1.2)
Total Protein: 7.4 g/dL (ref 6.0–8.3)

## 2022-04-14 LAB — CBC WITH DIFFERENTIAL/PLATELET
Basophils Absolute: 0.1 10*3/uL (ref 0.0–0.1)
Basophils Relative: 0.8 % (ref 0.0–3.0)
Eosinophils Absolute: 0.1 10*3/uL (ref 0.0–0.7)
Eosinophils Relative: 1.7 % (ref 0.0–5.0)
HCT: 44.2 % (ref 36.0–46.0)
Hemoglobin: 14.9 g/dL (ref 12.0–15.0)
Lymphocytes Relative: 35 % (ref 12.0–46.0)
Lymphs Abs: 2.4 10*3/uL (ref 0.7–4.0)
MCHC: 33.6 g/dL (ref 30.0–36.0)
MCV: 93.5 fl (ref 78.0–100.0)
Monocytes Absolute: 0.3 10*3/uL (ref 0.1–1.0)
Monocytes Relative: 4.1 % (ref 3.0–12.0)
Neutro Abs: 4 10*3/uL (ref 1.4–7.7)
Neutrophils Relative %: 58.4 % (ref 43.0–77.0)
Platelets: 201 10*3/uL (ref 150.0–400.0)
RBC: 4.73 Mil/uL (ref 3.87–5.11)
RDW: 12.6 % (ref 11.5–15.5)
WBC: 6.8 10*3/uL (ref 4.0–10.5)

## 2022-04-14 LAB — TSH: TSH: 2.61 u[IU]/mL (ref 0.35–5.50)

## 2022-04-14 MED ORDER — NA SULFATE-K SULFATE-MG SULF 17.5-3.13-1.6 GM/177ML PO SOLN: 1.0000 | Freq: Once | ORAL | 0 refills | Status: AC

## 2022-04-14 MED ORDER — SERTRALINE HCL 100 MG PO TABS: 100.0000 mg | ORAL_TABLET | Freq: Every day | ORAL | 3 refills | Status: DC

## 2022-04-14 NOTE — Telephone Encounter (Signed)
Gastroenterology Pre-Procedure Review  Request Date: 04/19/22 Requesting Physician: Dr. Vicente Males  PATIENT REVIEW QUESTIONS: The patient responded to the following health history questions as indicated:    1. Are you having any GI issues? No Referral noted: Father had history of many polyps (? Familial polyposis)  Pt was told to start getting colonoscopies at 78 2. Do you have a personal history of Polyps? no 3. Do you have a family history of Colon Cancer or Polyps? yes (father colon polyps, uncles and aunts colon polyps)  4. Diabetes Mellitus? no 5. Joint replacements in the past 12 months?no 6. Major health problems in the past 3 months?no 7. Any artificial heart valves, MVP, or defibrillator?no    MEDICATIONS & ALLERGIES:    Patient reports the following regarding taking any anticoagulation/antiplatelet therapy:   Plavix, Coumadin, Eliquis, Xarelto, Lovenox, Pradaxa, Brilinta, or Effient? no Aspirin? no  Patient confirms/reports the following medications:  Current Outpatient Medications  Medication Sig Dispense Refill   Amphetamine ER (ADZENYS XR-ODT) 12.5 MG TBED      cetirizine (ZYRTEC) 10 MG tablet TAKE 1 TABLET BY MOUTH EVERY DAY (Patient taking differently: Take 10 mg by mouth daily.) 30 tablet 3   Multiple Vitamins-Minerals (MULTIVITAMIN WITH MINERALS) tablet Take 1 tablet by mouth daily.     sertraline (ZOLOFT) 100 MG tablet Take 1 tablet (100 mg total) by mouth daily. 90 tablet 3   No current facility-administered medications for this visit.    Patient confirms/reports the following allergies:  Allergies  Allergen Reactions   Fexofenadine-Pseudoephed Er     Sudafed  palpatations, felt allegra stopped working   Pseudoephedrine Palpitations    REACTION: reaction not known    No orders of the defined types were placed in this encounter.   AUTHORIZATION INFORMATION Primary Insurance: 1D#: Group #:  Secondary Insurance: 1D#: Group #:  SCHEDULE  INFORMATION: Date:  Time: Location:

## 2022-04-14 NOTE — Progress Notes (Signed)
Subjective:    Patient ID: [name], female    DOB: November 29, 1985, 36 y.o.   MRN: [id number]  HPI Here for health maintenance exam and to review chronic medical problems    Wt Readings from Last 3 Encounters:  04/14/22 167 lb 2 oz (75.8 kg)  02/10/21 176 lb 6 oz (80 kg)  12/17/20 180 lb 12.8 oz (82 kg)   29.60 kg/m  Doing well  Very busy  Kids in soccer  Building a house and should be done in June  Selling current house  Working    Immunization History  Administered Date(s) Administered   Influenza,inj,Quad PF,6+ Mos 03/01/2018   Influenza-Unspecified 04/05/2017   Td 04/14/2004   Tdap 01/26/2016   Health Maintenance Due  Topic Date Due   INFLUENZA VACCINE  01/12/2022   PAP SMEAR-Modifier  03/06/2022   Flu shot : declines   Pap 02/2019 nl with neg HPV  Had a hysterectomy for endometriosis  Doing well   Will schedule with gyn for breast exam   Father had colon polyps/ a lot  ? If familial polyposis  She was told by her father's doctor to start getting them at 35   No stool changes    Takes mvi  Diet : healthy Gets enough produce  Wt is down -working on it    Exercise : very busy and active Walks in her neighborhood 30 min 4 times per week   GAD/ mood  Sertraline 100 mg daily  Does very well with medicine  If she misses a dose gets very anx and irritable  Exercise helps   Stress level is up   ADD Takes adderall-had to change back  Works well for her - Dr Sung Amabile   BP Readings from Last 3 Encounters:  04/14/22 110/66  02/10/21 118/70  12/17/20 119/80     Pulse Readings from Last 3 Encounters:  04/14/22 72  02/10/21 65  12/17/20 98    Last cholesterol Lab Results  Component Value Date   CHOL 145 02/03/2021   HDL 66.50 02/03/2021   LDLCALC 73 02/03/2021   TRIG 29.0 02/03/2021   CHOLHDL 2 02/03/2021    Patient Active Problem List   Diagnosis Date Noted   Colon cancer screening 04/14/2022   Family history of colon polyps,  unspecified 04/14/2022   Adenomyosis 11/12/2020   History of endometriosis 11/12/2020   S/P laparoscopic assisted vaginal hysterectomy (LAVH) 11/03/2020   GAD (generalized anxiety disorder) 09/05/2019   Routine general medical examination at a health care facility 01/26/2016   Attention deficit disorder 11/24/2006   ALLERGIC RHINITIS 11/21/2006   OVARIAN CYST 11/21/2006   NEPHROLITHIASIS, HX OF 11/21/2006   Past Medical History:  Diagnosis Date   ADD (attention deficit disorder)    Allergic rhinitis    Anxiety    Endometriosis    Headache(784.0)    Other acne    Other and unspecified ovarian cyst    Personal history of urinary calculi    Past Surgical History:  Procedure Laterality Date   APPENDECTOMY  3/07   DILATION AND CURETTAGE OF UTERUS  4/10   miscarriage   LAPAROSCOPIC VAGINAL HYSTERECTOMY WITH SALPINGECTOMY Bilateral 11/03/2020   Procedure: LAPAROSCOPIC ASSISTED VAGINAL HYSTERECTOMY WITH SALPINGECTOMY;  Surgeon: Rubie Maid, MD;  Location: [facility] ORS;  Service: Gynecology;  Laterality: Bilateral;   LAPAROSCOPY  2006   pelvic--endometriosis   MOLE REMOVAL     ? atypical   Social History   Tobacco Use   Smoking  status: Never   Smokeless tobacco: Never  Vaping Use   Vaping Use: Never used  Substance Use Topics   Alcohol use: Not Currently    Alcohol/week: 3.0 standard drinks of alcohol    Types: 3 Glasses of wine per week   Drug use: No   Family History  Problem Relation Age of Onset   Colon polyps Father    Other Father        Brain tumor   Depression Mother    Allergies  Allergen Reactions   Fexofenadine-Pseudoephed Er     Sudafed  palpatations, felt allegra stopped working   Pseudoephedrine Palpitations    REACTION: reaction not known   Current Outpatient Medications on File Prior to Visit  Medication Sig Dispense Refill   Amphetamine ER (ADZENYS XR-ODT) 12.5 MG TBED      cetirizine (ZYRTEC) 10 MG tablet TAKE 1 TABLET BY MOUTH EVERY DAY (Patient  taking differently: Take 10 mg by mouth daily.) 30 tablet 3   Multiple Vitamins-Minerals (MULTIVITAMIN WITH MINERALS) tablet Take 1 tablet by mouth daily.     No current facility-administered medications on file prior to visit.     Review of Systems  Constitutional:  Negative for activity change, appetite change, fatigue, fever and unexpected weight change.  HENT:  Negative for congestion, ear pain, rhinorrhea, sinus pressure and sore throat.   Eyes:  Negative for pain, redness and visual disturbance.  Respiratory:  Negative for cough, shortness of breath and wheezing.   Cardiovascular:  Negative for chest pain and palpitations.  Gastrointestinal:  Negative for abdominal pain, blood in stool, constipation and diarrhea.  Endocrine: Negative for polydipsia and polyuria.  Genitourinary:  Negative for dysuria, frequency and urgency.  Musculoskeletal:  Negative for arthralgias, back pain and myalgias.  Skin:  Negative for pallor and rash.  Allergic/Immunologic: Negative for environmental allergies.  Neurological:  Negative for dizziness, syncope and headaches.  Hematological:  Negative for adenopathy. Does not bruise/bleed easily.  Psychiatric/Behavioral:  Negative for decreased concentration and dysphoric mood. The patient is not nervous/anxious.        Irritable if she misses medicine        Objective:   Physical Exam Constitutional:      General: She is not in acute distress.    Appearance: Normal appearance. She is well-developed. She is not ill-appearing or diaphoretic.     Comments: overwt  HENT:     Head: Normocephalic and atraumatic.     Right Ear: Tympanic membrane, ear canal and external ear normal.     Left Ear: Tympanic membrane, ear canal and external ear normal.     Nose: Nose normal. No congestion.     Mouth/Throat:     Mouth: Mucous membranes are moist.     Pharynx: Oropharynx is clear. No posterior oropharyngeal erythema.  Eyes:     General: No scleral icterus.     Extraocular Movements: Extraocular movements intact.     Conjunctiva/sclera: Conjunctivae normal.     Pupils: Pupils are equal, round, and reactive to light.  Neck:     Thyroid: No thyromegaly.     Vascular: No carotid bruit or JVD.  Cardiovascular:     Rate and Rhythm: Normal rate and regular rhythm.     Pulses: Normal pulses.     Heart sounds: Normal heart sounds.     No gallop.  Pulmonary:     Effort: Pulmonary effort is normal. No respiratory distress.     Breath sounds: Normal breath sounds.  No wheezing.     Comments: Good air exch Chest:     Chest wall: No tenderness.  Abdominal:     General: Bowel sounds are normal. There is no distension or abdominal bruit.     Palpations: Abdomen is soft. There is no mass.     Tenderness: There is no abdominal tenderness.     Hernia: No hernia is present.  Genitourinary:    Comments: Breast and pelvic exam are done by gyn provider   Musculoskeletal:        General: No tenderness. Normal range of motion.     Cervical back: Normal range of motion and neck supple. No rigidity. No muscular tenderness.     Right lower leg: No edema.     Left lower leg: No edema.     Comments: No kyphosis   Lymphadenopathy:     Cervical: No cervical adenopathy.  Skin:    General: Skin is warm and dry.     Coloration: Skin is not pale.     Findings: No erythema or rash.  Neurological:     Mental Status: She is alert. Mental status is at baseline.     Cranial Nerves: No cranial nerve deficit.     Motor: No abnormal muscle tone.     Coordination: Coordination normal.     Gait: Gait normal.     Deep Tendon Reflexes: Reflexes are normal and symmetric. Reflexes normal.  Psychiatric:        Mood and Affect: Mood normal.        Cognition and Memory: Cognition and memory normal.           Assessment & Plan:   Problem List Items Addressed This Visit       Other   Attention deficit disorder    Per pt back on adderall  Sees a separate  provider Doing fairly welll      Colon cancer screening    Pt has h/o colon polyps in family (sounds like familial polyposis possibly)  Was told to get colonoscopy at 65       Relevant Orders   Ambulatory referral to Gastroenterology   Family history of colon polyps, unspecified    Is sounds like familial polyposis  Her father had this in 30s-40s  She was told by her father's physician in the past that she needed a first colonoscopy at 37  Will refer to GI for consult/discussion       Relevant Orders   Ambulatory referral to Gastroenterology   GAD (generalized anxiety disorder)    Does well with sertraline 100 mg daily if she does not miss a dose Reviewed stressors/ coping techniques/symptoms/ support sources/ tx options and side effects in detail today Also treated for ADD Enc good exercise and self care       Relevant Medications   sertraline (ZOLOFT) 100 MG tablet   Routine general medical examination at a health care facility - Primary    Reviewed health habits including diet and exercise and skin cancer prevention Reviewed appropriate screening tests for age  Also reviewed health mt list, fam hx and immunization status , as well as social and family history   See HPI Labs ordered  Declines flu shot  Pap utd and has had a hysterectomy She prefers breast exam at her gyn and will sched appt  Ref to gi to discuss colonoscopy rec for fam h/o polps Enc her to continue healthy diet and exercise  Mood is stable despite  inc stress Enc good self care       Relevant Orders   TSH   Lipid panel   Comprehensive metabolic panel   CBC with Differential/Platelet

## 2022-04-14 NOTE — Assessment & Plan Note (Signed)
Per pt back on adderall  Sees a separate provider Doing fairly welll

## 2022-04-14 NOTE — Assessment & Plan Note (Signed)
Reviewed health habits including diet and exercise and skin cancer prevention Reviewed appropriate screening tests for age  Also reviewed health mt list, fam hx and immunization status , as well as social and family history   See HPI Labs ordered  Declines flu shot  Pap utd and has had a hysterectomy She prefers breast exam at her gyn and will sched appt  Ref to gi to discuss colonoscopy rec for fam h/o polps Enc her to continue healthy diet and exercise  Mood is stable despite inc stress Enc good self care

## 2022-04-14 NOTE — Assessment & Plan Note (Signed)
Is sounds like familial polyposis  Her father had this in 30s-40s  She was told by her father's physician in the past that she needed a first colonoscopy at 40  Will refer to GI for consult/discussion

## 2022-04-14 NOTE — Assessment & Plan Note (Signed)
Does well with sertraline 100 mg daily if she does not miss a dose Reviewed stressors/ coping techniques/symptoms/ support sources/ tx options and side effects in detail today Also treated for ADD Enc good exercise and self care

## 2022-04-14 NOTE — Assessment & Plan Note (Signed)
Pt has h/o colon polyps in family (sounds like familial polyposis possibly)  Was told to get colonoscopy at 68

## 2022-04-14 NOTE — Patient Instructions (Addendum)
Call [facility] to schedule a consultation to discuss getting a colonoscopy   [facility]  [phone]  Take care of yourself  Keep eating healthy  Keep walking  Add some strength training when you can   Lab today

## 2022-04-19 ENCOUNTER — Ambulatory Visit
Admission: RE | Admit: 2022-04-19 | Discharge: 2022-04-19 | Disposition: A | Discharge status: Home / Self Care | Payer: BC Managed Care – PPO | Attending: Gastroenterology | Admitting: Gastroenterology

## 2022-04-19 ENCOUNTER — Encounter: Payer: Self-pay | Admitting: Gastroenterology

## 2022-04-19 ENCOUNTER — Ambulatory Visit: Payer: BC Managed Care – PPO | Admitting: Certified Registered Nurse Anesthetist

## 2022-04-19 ENCOUNTER — Encounter
Admission: RE | Disposition: A | Discharge status: Home / Self Care | Payer: Self-pay | Source: Home / Self Care | Attending: Gastroenterology

## 2022-04-19 ENCOUNTER — Other Ambulatory Visit: Payer: Self-pay

## 2022-04-19 DIAGNOSIS — Z83719 Family history of colon polyps, unspecified: Secondary | ICD-10-CM | POA: Diagnosis not present

## 2022-04-19 DIAGNOSIS — Z1211 Encounter for screening for malignant neoplasm of colon: Secondary | ICD-10-CM | POA: Insufficient documentation

## 2022-04-19 HISTORY — PX: COLONOSCOPY WITH PROPOFOL: SHX5780

## 2022-04-19 SURGERY — COLONOSCOPY WITH PROPOFOL
Anesthesia: General

## 2022-04-19 MED ORDER — SODIUM CHLORIDE 0.9 % IV SOLN: INTRAVENOUS | Status: DC

## 2022-04-19 MED ORDER — STERILE WATER FOR IRRIGATION IR SOLN
Status: DC | PRN
  Administered 2022-04-19: 60 mL
  Administered 2022-04-19: 120 mL
  Administered 2022-04-19: 60 mL

## 2022-04-19 MED ORDER — PROPOFOL 10 MG/ML IV BOLUS
INTRAVENOUS | Status: DC | PRN
  Administered 2022-04-19: 70 mg via INTRAVENOUS
  Administered 2022-04-19: 10 mg via INTRAVENOUS

## 2022-04-19 MED ORDER — PROPOFOL 500 MG/50ML IV EMUL
INTRAVENOUS | Status: DC | PRN
  Administered 2022-04-19: 160 ug/kg/min via INTRAVENOUS

## 2022-04-19 MED ORDER — GLYCOPYRROLATE 0.2 MG/ML IJ SOLN
INTRAMUSCULAR | Status: AC
  Filled 2022-04-19: qty 1

## 2022-04-19 MED ORDER — LIDOCAINE HCL (PF) 2 % IJ SOLN
INTRAMUSCULAR | Status: AC
  Filled 2022-04-19: qty 10

## 2022-04-19 MED ORDER — PROPOFOL 10 MG/ML IV BOLUS
INTRAVENOUS | Status: AC
  Filled 2022-04-19: qty 20

## 2022-04-19 MED ORDER — PROPOFOL 1000 MG/100ML IV EMUL
INTRAVENOUS | Status: AC
  Filled 2022-04-19: qty 200

## 2022-04-19 NOTE — Anesthesia Postprocedure Evaluation (Signed)
Anesthesia Post Note  Patient: [name]  Procedure(s) Performed: COLONOSCOPY WITH PROPOFOL  Patient location during evaluation: Endoscopy Anesthesia Type: General Level of consciousness: awake and alert Pain management: pain level controlled Vital Signs Assessment: post-procedure vital signs reviewed and stable Respiratory status: spontaneous breathing, nonlabored ventilation, respiratory function stable and patient connected to nasal cannula oxygen Cardiovascular status: blood pressure returned to baseline and stable Postop Assessment: no apparent nausea or vomiting Anesthetic complications: no   No notable events documented.   Last Vitals:  Vitals:   04/19/22 0811 04/19/22 0821  BP: (!) 90/51 98/66  Pulse: 83 60  Resp: 18 19  Temp:    SpO2: 100% 100%    Last Pain:  Vitals:   04/19/22 0821  TempSrc:   PainSc: 0-No pain                 Precious Haws Sufian Ravi

## 2022-04-19 NOTE — Op Note (Signed)
[facility] Gastroenterology Patient Name: [name] Procedure Date: 04/19/2022 7:09 AM MRN: [id number] Account #: [id number] Date of Birth: 1986-02-21 Admit Type: Outpatient Age: 36 Room: [facility] ENDO ROOM 1 Gender: Female Note Status: Finalized Instrument Name: Prentice Docker [id number] Procedure:             Colonoscopy Indications:           Screening in patient at increased risk: Colorectal                         cancer in father before age 71 Providers:             Wyline Mood MD, MD Referring MD:          Audrie Gallus. Tower (Referring MD) Medicines:             Monitored Anesthesia Care Complications:         No immediate complications. Procedure:             Pre-Anesthesia Assessment:                        - Prior to the procedure, a History and Physical was                         performed, and patient medications, allergies and                         sensitivities were reviewed. The patient's tolerance                         of previous anesthesia was reviewed.                        - The risks and benefits of the procedure and the                         sedation options and risks were discussed with the                         patient. All questions were answered and informed                         consent was obtained.                        - ASA Grade Assessment: II - A patient with mild                         systemic disease.                        After obtaining informed consent, the colonoscope was                         passed under direct vision. Throughout the procedure,                         the patient's blood pressure, pulse, and oxygen  saturations were monitored continuously. The                         Colonoscope was introduced through the anus and                         advanced to the the cecum, identified by the                         appendiceal orifice. The colonoscopy was performed                          with ease. The patient tolerated the procedure well.                         The quality of the bowel preparation was excellent.                         The appendiceal orifice was photographed. Findings:      The entire examined colon appeared normal on direct and retroflexion       views. Impression:            - The entire examined colon is normal on direct and                         retroflexion views.                        - No specimens collected. Recommendation:        - Discharge patient to home (with escort).                        - Resume previous diet.                        - Continue present medications.                        - Repeat colonoscopy in 5 years for screening purposes. Procedure Code(s):     --- Professional ---                        [phone], Colonoscopy, flexible; diagnostic, including                         collection of specimen(s) by brushing or washing, when                         performed (separate procedure) Diagnosis Code(s):     --- Professional ---                        Z80.0, Family history of malignant neoplasm of                         digestive organs CPT copyright 2022 American Medical Association. All rights reserved. The codes documented in this report are preliminary and upon coder review may  be revised to meet current compliance requirements. Wyline Mood, MD Wyline Mood MD, MD 04/19/2022 7:59:10 AM This report has been signed electronically. Number of  Addenda: 0 Note Initiated On: 04/19/2022 7:09 AM Scope Withdrawal Time: 0 hours 5 minutes 50 seconds  Total Procedure Duration: 0 hours 7 minutes 40 seconds  Estimated Blood Loss:  Estimated blood loss: none.      [facility]

## 2022-04-19 NOTE — Anesthesia Preprocedure Evaluation (Addendum)
Anesthesia Evaluation  Patient identified by MRN, date of birth, ID band Patient awake    Reviewed: Allergy & Precautions, NPO status , Patient's Chart, lab work & pertinent test results  History of Anesthesia Complications Negative for: history of anesthetic complications  Airway Mallampati: III  TM Distance: >3 FB Neck ROM: full    Dental  (+) Chipped   Pulmonary neg pulmonary ROS, neg shortness of breath   Pulmonary exam normal        Cardiovascular Exercise Tolerance: Good (-) angina negative cardio ROS Normal cardiovascular exam     Neuro/Psych  Headaches PSYCHIATRIC DISORDERS         GI/Hepatic negative GI ROS, Neg liver ROS,,,  Endo/Other  negative endocrine ROS    Renal/GU negative Renal ROS  negative genitourinary   Musculoskeletal   Abdominal   Peds  Hematology negative hematology ROS (+)   Anesthesia Other Findings Past Medical History: No date: ADD (attention deficit disorder) No date: Allergic rhinitis No date: Anxiety No date: Endometriosis No date: Headache(784.0) No date: Other acne No date: Other and unspecified ovarian cyst No date: Personal history of urinary calculi  Past Surgical History: No date: ABDOMINAL HYSTERECTOMY 08/12/2005: APPENDECTOMY 09/12/2008: DILATION AND CURETTAGE OF UTERUS     Comment:  miscarriage 11/03/2020: LAPAROSCOPIC VAGINAL HYSTERECTOMY WITH SALPINGECTOMY;  Bilateral     Comment:  Procedure: LAPAROSCOPIC ASSISTED VAGINAL HYSTERECTOMY               WITH SALPINGECTOMY;  Surgeon: Rubie Maid, MD;                Location: [facility] ORS;  Service: Gynecology;  Laterality:               Bilateral; 06/14/2004: LAPAROSCOPY     Comment:  pelvic--endometriosis No date: MOLE REMOVAL     Comment:  ? atypical  BMI    Body Mass Index: 29.23 kg/m      Reproductive/Obstetrics negative OB ROS                             Anesthesia  Physical Anesthesia Plan  ASA: 2  Anesthesia Plan: General   Post-op Pain Management:    Induction: Intravenous  PONV Risk Score and Plan: Propofol infusion and TIVA  Airway Management Planned: Natural Airway and Nasal Cannula  Additional Equipment:   Intra-op Plan:   Post-operative Plan:   Informed Consent: I have reviewed the patients History and Physical, chart, labs and discussed the procedure including the risks, benefits and alternatives for the proposed anesthesia with the patient or authorized representative who has indicated his/her understanding and acceptance.     Dental Advisory Given  Plan Discussed with: Anesthesiologist, CRNA and Surgeon  Anesthesia Plan Comments: (Patient consented for risks of anesthesia including but not limited to:  - adverse reactions to medications - risk of airway placement if required - damage to eyes, teeth, lips or other oral mucosa - nerve damage due to positioning  - sore throat or hoarseness - Damage to heart, brain, nerves, lungs, other parts of body or loss of life  Patient voiced understanding.)       Anesthesia Quick Evaluation

## 2022-04-19 NOTE — Anesthesia Procedure Notes (Signed)
Date/Time: 04/19/2022 7:51 AM  Performed by: Demetrius Charity, CRNAPre-anesthesia Checklist: Patient identified, Emergency Drugs available, Suction available, Patient being monitored and Timeout performed Patient Re-evaluated:Patient Re-evaluated prior to induction Oxygen Delivery Method: Nasal cannula Induction Type: IV induction Placement Confirmation: CO2 detector and positive ETCO2

## 2022-04-19 NOTE — Transfer of Care (Signed)
Immediate Anesthesia Transfer of Care Note  Patient: [name]  Procedure(s) Performed: COLONOSCOPY WITH PROPOFOL  Patient Location: PACU  Anesthesia Type:General  Level of Consciousness: drowsy  Airway & Oxygen Therapy: Patient Spontanous Breathing  Post-op Assessment: Report given to RN and Post -op Vital signs reviewed and stable  Post vital signs: Reviewed and stable  Last Vitals:  Vitals Value Taken Time  BP 82/41 04/19/22 0801  Temp    Pulse    Resp    SpO2    Vitals shown include unvalidated device data.  Last Pain:  Vitals:   04/19/22 0714  TempSrc: Temporal         Complications: No notable events documented.

## 2022-04-19 NOTE — H&P (Signed)
Wyline Mood, MD [address], [address], [place], [place], [address] [address], [place], [place], [address] Phone: [phone]  Fax: [phone]  Primary Care Physician:  Tower, Audrie Gallus, MD   Pre-Procedure History & Physical: HPI:  [name] is a 36 y.o. female is here for an colonoscopy.   Past Medical History:  Diagnosis Date   ADD (attention deficit disorder)    Allergic rhinitis    Anxiety    Endometriosis    Headache(784.0)    Other acne    Other and unspecified ovarian cyst    Personal history of urinary calculi     Past Surgical History:  Procedure Laterality Date   ABDOMINAL HYSTERECTOMY     APPENDECTOMY  08/12/2005   DILATION AND CURETTAGE OF UTERUS  09/12/2008   miscarriage   LAPAROSCOPIC VAGINAL HYSTERECTOMY WITH SALPINGECTOMY Bilateral 11/03/2020   Procedure: LAPAROSCOPIC ASSISTED VAGINAL HYSTERECTOMY WITH SALPINGECTOMY;  Surgeon: Hildred Laser, MD;  Location: [facility] ORS;  Service: Gynecology;  Laterality: Bilateral;   LAPAROSCOPY  06/14/2004   pelvic--endometriosis   MOLE REMOVAL     ? atypical    Prior to Admission medications   Medication Sig Start Date End Date Taking? Authorizing Provider  Amphetamine ER (ADZENYS XR-ODT) 12.5 MG TBED  06/12/21  Yes [provider]  sertraline (ZOLOFT) 100 MG tablet Take 1 tablet (100 mg total) by mouth daily. 04/14/22  Yes Tower, Audrie Gallus, MD  cetirizine (ZYRTEC) 10 MG tablet TAKE 1 TABLET BY MOUTH EVERY DAY Patient taking differently: Take 10 mg by mouth daily. 02/20/18   Ratcliffe, Heather R, PA-C  Multiple Vitamins-Minerals (MULTIVITAMIN WITH MINERALS) tablet Take 1 tablet by mouth daily.    [provider]    Allergies as of 04/15/2022 - Review Complete 04/14/2022  Allergen Reaction Noted   Fexofenadine-pseudoephed er  11/21/2006   Pseudoephedrine Palpitations 11/21/2006    Family History  Problem Relation Age of Onset   Colon polyps Father    Other Father         Brain tumor   Depression Mother     Social History   Socioeconomic History   Marital status: Married    Spouse name: Not on file   Number of children: 1   Years of education: Not on file   Highest education level: Not on file  Occupational History   Occupation: Veterinary office  Tobacco Use   Smoking status: Never   Smokeless tobacco: Never  Vaping Use   Vaping Use: Never used  Substance and Sexual Activity   Alcohol use: Not Currently    Alcohol/week: 3.0 standard drinks of alcohol    Types: 3 Glasses of wine per week    Comment: occasionally,none last 24 hrs   Drug use: No   Sexual activity: Not Currently    Birth control/protection: Surgical    Comment: hysterectomy  Other Topics Concern   Not on file  Social History Narrative   Married (Husband works nights--police work)      1 daughter (2011)      Nurse, learning disability   Social Determinants of Corporate investment banker Strain: Not on Ship broker Insecurity: Not on file  Transportation Needs: Not on file  Physical Activity: Not on file  Stress: Not on file  Social Connections: Not on file  Intimate Partner Violence: Not on file    Review of Systems: See HPI, otherwise negative ROS  Physical Exam: BP 99/72   Pulse 78  Temp (!) 96.3 F (35.7 C) (Temporal)   Resp 14   Ht 5\' 3"  (1.6 m)   Wt 74.8 kg   LMP 08/21/2020   SpO2 96%   BMI 29.23 kg/m  General:   Alert,  pleasant and cooperative in NAD Head:  Normocephalic and atraumatic. Neck:  Supple; no masses or thyromegaly. Lungs:  Clear throughout to auscultation, normal respiratory effort.    Heart:  +S1, +S2, Regular rate and rhythm, No edema. Abdomen:  Soft, nontender and nondistended. Normal bowel sounds, without guarding, and without rebound.   Neurologic:  Alert and  oriented x4;  grossly normal neurologically.  Impression/Plan: [name] is here for an colonoscopy to be performed for Screening colonoscopy a, famioly history of colon  polyps Risks, benefits, limitations, and alternatives regarding  colonoscopy have been reviewed with the patient.  Questions have been answered.  All parties agreeable.   Jonathon Bellows, MD  04/19/2022, 7:46 AM

## 2022-04-20 ENCOUNTER — Encounter: Payer: Self-pay | Admitting: Gastroenterology

## 2023-03-16 ENCOUNTER — Ambulatory Visit: Payer: Self-pay | Admitting: Nurse Practitioner

## 2023-03-16 ENCOUNTER — Encounter: Payer: Self-pay | Admitting: Nurse Practitioner

## 2023-03-16 VITALS — BP 106/60 | HR 77 | Temp 98.4°F | Ht 64.0 in | Wt 176.8 lb

## 2023-03-16 DIAGNOSIS — F411 Generalized anxiety disorder: Secondary | ICD-10-CM

## 2023-03-16 DIAGNOSIS — F988 Other specified behavioral and emotional disorders with onset usually occurring in childhood and adolescence: Secondary | ICD-10-CM

## 2023-03-16 DIAGNOSIS — E669 Obesity, unspecified: Secondary | ICD-10-CM

## 2023-03-16 MED ORDER — WEGOVY 0.25 MG/0.5ML ~~LOC~~ SOAJ
0.2500 mg | SUBCUTANEOUS | 0 refills | Status: DC
  Filled 2023-03-18: qty 2, 28d supply, fill #0

## 2023-03-16 MED ORDER — WEGOVY 0.5 MG/0.5ML ~~LOC~~ SOAJ
0.5000 mg | SUBCUTANEOUS | 0 refills | Status: DC
  Filled 2023-04-13: qty 2, 28d supply, fill #0

## 2023-03-16 MED ORDER — WEGOVY 1 MG/0.5ML ~~LOC~~ SOAJ
1.0000 mg | SUBCUTANEOUS | 0 refills | Status: DC
  Filled 2023-05-09: qty 2, 28d supply, fill #0

## 2023-03-16 NOTE — Assessment & Plan Note (Signed)
Chronic. Stable on Zoloft 100 mg daily. Continue. Encouraged to contact if worsening symptoms, unusual behavior changes or suicidal thoughts occur. Will continue to monitor.

## 2023-03-16 NOTE — Assessment & Plan Note (Signed)
Chronic. Managed by Psychiatry. Stable on current medication regimen. Follow up as scheduled.

## 2023-03-16 NOTE — Progress Notes (Signed)
Bethanie Dicker, NP-C Phone: [phone]  [name] is a 37 y.o. female who presents today for transfer of care.   She is interested in discussing medication to help with weight loss. She has been struggling with her weight for the past 2 years since having a hysterectomy in May 2022 due to endometriosis. She has tried multiple different diets including Weight Watchers as well as exercising and has not been losing weight. She has gained 9 pounds in the last year. She does not eat many carbs, she mostly eats yogurt, grilled chicken, salads and fruit. She has been walking 30 minutes 4-5 times per week and doing weight training with small weights. Her goal weight is 135 pounds.   Active Ambulatory Problems    Diagnosis Date Noted   Attention deficit disorder 11/24/2006   Allergic rhinitis 11/21/2006   OVARIAN CYST 11/21/2006   NEPHROLITHIASIS, HX OF 11/21/2006   Routine general medical examination at a health care facility 01/26/2016   GAD (generalized anxiety disorder) 09/05/2019   S/P laparoscopic assisted vaginal hysterectomy (LAVH) 11/03/2020   Adenomyosis 11/12/2020   History of endometriosis 11/12/2020   Colon cancer screening 04/14/2022   Family history of polyps in the colon 04/14/2022   Obesity (BMI 30-39.9) 03/16/2023   Resolved Ambulatory Problems    Diagnosis Date Noted   ACNE VULGARIS 11/21/2006   NECK PAIN 02/18/2010   Headache 02/18/2010   Weight loss, unintentional 04/22/2011   Anal fissure 06/02/2011   Palpitations 01/18/2013   Shortness of breath 01/18/2013   Past Medical History:  Diagnosis Date   ADD (attention deficit disorder)    Allergic rhinitis    Anxiety    Endometriosis    Headache(784.0)     Family History  Problem Relation Age of Onset   Colon polyps Father    Other Father        Brain tumor   Depression Mother     Social History   Socioeconomic History   Marital status: Married    Spouse name: Not on file   Number of children: 1    Years of education: Not on file   Highest education level: Not on file  Occupational History   Occupation: Veterinary office  Tobacco Use   Smoking status: Never   Smokeless tobacco: Never  Vaping Use   Vaping status: Never Used  Substance and Sexual Activity   Alcohol use: Not Currently    Alcohol/week: 3.0 standard drinks of alcohol    Types: 3 Glasses of wine per week    Comment: occasionally,none last 24 hrs   Drug use: No   Sexual activity: Not Currently    Birth control/protection: Surgical    Comment: hysterectomy  Other Topics Concern   Not on file  Social History Narrative   Married (Husband works nights--police work)      1 daughter (2011)      Nurse, learning disability   Social Determinants of Corporate investment banker Strain: Not on Ship broker Insecurity: Not on file  Transportation Needs: Not on file  Physical Activity: Not on file  Stress: Not on file  Social Connections: Not on file  Intimate Partner Violence: Not on file    ROS  General:  Negative for unexplained weight loss, fever Skin: Negative for new or changing mole, sore that won't heal HEENT: Negative for trouble hearing, trouble seeing, ringing in ears, mouth sores, hoarseness, change in voice, dysphagia. CV:  Negative for chest pain, dyspnea, edema, palpitations Resp: Negative  for cough, dyspnea, hemoptysis GI: Negative for nausea, vomiting, diarrhea, constipation, abdominal pain, melena, hematochezia. GU: Negative for dysuria, incontinence, urinary hesitance, hematuria, vaginal or penile discharge, polyuria, sexual difficulty, lumps in testicle or breasts MSK: Negative for muscle cramps or aches, joint pain or swelling Neuro: Negative for headaches, weakness, numbness, dizziness, passing out/fainting Psych: Negative for depression, anxiety, memory problems  Objective  Physical Exam Vitals:   03/16/23 0910  BP: 106/60  Pulse: 77  Temp: 98.4 F (36.9 C)  SpO2: 97%    BP Readings from  Last 3 Encounters:  03/16/23 106/60  04/19/22 98/66  04/14/22 110/66   Wt Readings from Last 3 Encounters:  03/16/23 176 lb 12.8 oz (80.2 kg)  04/19/22 165 lb (74.8 kg)  04/14/22 167 lb 2 oz (75.8 kg)    Physical Exam Constitutional:      General: She is not in acute distress.    Appearance: Normal appearance.  HENT:     Head: Normocephalic.  Cardiovascular:     Rate and Rhythm: Normal rate and regular rhythm.     Heart sounds: Normal heart sounds.  Pulmonary:     Effort: Pulmonary effort is normal.     Breath sounds: Normal breath sounds.  Abdominal:     General: Abdomen is flat. Bowel sounds are normal.     Palpations: Abdomen is soft.     Tenderness: There is no abdominal tenderness.  Skin:    General: Skin is warm and dry.  Neurological:     General: No focal deficit present.     Mental Status: She is alert.  Psychiatric:        Mood and Affect: Mood normal.        Behavior: Behavior normal.    Assessment/Plan:   Obesity (BMI 30-39.9) Assessment & Plan: Will start the patient on Wegovy. Counseled on the risk of pancreatitis and gallbladder disease. Discussed the risk of nausea. Advised to discontinue the [facility] and contact us immediately if they develop abdominal pain. If they develop excessive nausea they will contact us right away. I discussed that medullary thyroid cancer has been seen in rats studies. The patient confirmed no personal or family history of thyroid cancer, parathyroid cancer, or adrenal gland cancer. Discussed that we thus far have not seen medullary thyroid cancer result from use of this type of medication in humans. Advised to monitor the thyroid area and contact us for any lumps, swelling, trouble swallowing, or any other changes in this area.  Discussed goal weight loss of 1 to 2 pounds a week while on this medication. Counseled at length on the importance of healthy diet, exercise and lifestyle modifications even with this medication. Advised to  increase protein in diet. She will follow up in 6 weeks, sooner if needed. Will continue to monitor.   Orders: -     Wegovy; Inject 0.25 mg into the skin once a week. X 4 weeks then increase to 0.5 mg.  Dispense: 2 mL; Refill: 0 -     UJWJXB; Inject 0.5 mg into the skin once a week. X 4 weeks then increase  Dispense: 2 mL; Refill: 0 -     Wegovy; Inject 1 mg into the skin once a week.  Dispense: 2 mL; Refill: 0  GAD (generalized anxiety disorder) Assessment & Plan: Chronic. Stable on Zoloft 100 mg daily. Continue. Encouraged to contact if worsening symptoms, unusual behavior changes or suicidal thoughts occur. Will continue to monitor.    Attention deficit disorder, unspecified  type Assessment & Plan: Chronic. Managed by Psychiatry. Stable on current medication regimen. Follow up as scheduled.      Return in about 6 weeks (around 04/27/2023) for Follow up.   Bethanie Dicker, NP-C [facility] [employer]

## 2023-03-16 NOTE — Assessment & Plan Note (Signed)
Will start the patient on Wegovy. Counseled on the risk of pancreatitis and gallbladder disease. Discussed the risk of nausea. Advised to discontinue the [facility] and contact us immediately if they develop abdominal pain. If they develop excessive nausea they will contact us right away. I discussed that medullary thyroid cancer has been seen in rats studies. The patient confirmed no personal or family history of thyroid cancer, parathyroid cancer, or adrenal gland cancer. Discussed that we thus far have not seen medullary thyroid cancer result from use of this type of medication in humans. Advised to monitor the thyroid area and contact us for any lumps, swelling, trouble swallowing, or any other changes in this area.  Discussed goal weight loss of 1 to 2 pounds a week while on this medication. Counseled at length on the importance of healthy diet, exercise and lifestyle modifications even with this medication. Advised to increase protein in diet. She will follow up in 6 weeks, sooner if needed. Will continue to monitor.

## 2023-03-17 ENCOUNTER — Encounter: Payer: Self-pay | Admitting: Nurse Practitioner

## 2023-03-17 ENCOUNTER — Telehealth: Payer: Self-pay

## 2023-03-17 ENCOUNTER — Other Ambulatory Visit (HOSPITAL_COMMUNITY): Payer: Self-pay

## 2023-03-17 NOTE — Telephone Encounter (Signed)
Pharmacy Patient Advocate Encounter   Received notification from Physician's Office that prior authorization for [facility] is required/requested.   Insurance verification completed.   The patient is insured through [facility] .   Per test claim: PA required; PA submitted to [facility] via CoverMyMeds Key/confirmation #/EOC (Key: [id number])  Status is pending

## 2023-03-18 ENCOUNTER — Other Ambulatory Visit: Payer: Self-pay

## 2023-03-18 ENCOUNTER — Other Ambulatory Visit (HOSPITAL_COMMUNITY): Payer: Self-pay

## 2023-03-18 NOTE — Telephone Encounter (Signed)
Pharmacy Patient Advocate Encounter  Received notification from [facility] that Prior Authorization for [facility] has been APPROVED from 10.3.24 to 5.1.25. Ran test claim, Copay is $24.99. This test claim was processed through [facility]- copay amounts may vary at other pharmacies due to pharmacy/plan contracts, or as the patient moves through the different stages of their insurance plan.   PA #/Case ID/Reference #: (Key: [id number])

## 2023-03-18 NOTE — Telephone Encounter (Signed)
Patient is aware of the PA approval.

## 2023-03-21 ENCOUNTER — Other Ambulatory Visit (HOSPITAL_COMMUNITY): Payer: Self-pay

## 2023-03-24 ENCOUNTER — Other Ambulatory Visit (HOSPITAL_COMMUNITY): Payer: Self-pay

## 2023-04-13 ENCOUNTER — Other Ambulatory Visit: Payer: Self-pay

## 2023-04-26 NOTE — Progress Notes (Unsigned)
  Bethanie Dicker, NP-C Phone: [phone]  [name] is a 37 y.o. female who presents today for follow up.   ***  Social History   Tobacco Use  Smoking Status Never  Smokeless Tobacco Never    Current Outpatient Medications on File Prior to Visit  Medication Sig Dispense Refill   Amphetamine Sulfate 10 MG TABS Take 2 tablets by mouth 2 (two) times daily.     cetirizine (ZYRTEC) 10 MG tablet TAKE 1 TABLET BY MOUTH EVERY DAY (Patient taking differently: Take 10 mg by mouth daily.) 30 tablet 3   Multiple Vitamins-Minerals (MULTIVITAMIN WITH MINERALS) tablet Take 1 tablet by mouth daily.     Semaglutide-Weight Management (WEGOVY) 0.25 MG/0.5ML SOAJ Inject 0.25 mg into the skin once a week. X 4 weeks then increase to 0.5 mg. 2 mL 0   Semaglutide-Weight Management (WEGOVY) 0.5 MG/0.5ML SOAJ Inject 0.5 mg into the skin once a week. X 4 weeks then increase 2 mL 0   Semaglutide-Weight Management (WEGOVY) 1 MG/0.5ML SOAJ Inject 1 mg into the skin once a week. 2 mL 0   sertraline (ZOLOFT) 100 MG tablet Take 1 tablet (100 mg total) by mouth daily. 90 tablet 3   No current facility-administered medications on file prior to visit.     ROS see history of present illness  Objective  Physical Exam There were no vitals filed for this visit.  BP Readings from Last 3 Encounters:  03/16/23 106/60  04/19/22 98/66  04/14/22 110/66   Wt Readings from Last 3 Encounters:  03/16/23 176 lb 12.8 oz (80.2 kg)  04/19/22 165 lb (74.8 kg)  04/14/22 167 lb 2 oz (75.8 kg)    Physical Exam   Assessment/Plan: Please see individual problem list.  There are no diagnoses linked to this encounter.   Health Maintenance: ***  No follow-ups on file.   Bethanie Dicker, NP-C [facility] [employer]

## 2023-04-27 ENCOUNTER — Encounter: Payer: Self-pay | Admitting: Nurse Practitioner

## 2023-04-27 ENCOUNTER — Ambulatory Visit: Payer: No Typology Code available for payment source | Admitting: Nurse Practitioner

## 2023-04-27 VITALS — BP 116/70 | HR 75 | Temp 97.7°F | Ht 64.0 in | Wt 168.8 lb

## 2023-04-27 DIAGNOSIS — F411 Generalized anxiety disorder: Secondary | ICD-10-CM | POA: Diagnosis not present

## 2023-04-27 DIAGNOSIS — Z1329 Encounter for screening for other suspected endocrine disorder: Secondary | ICD-10-CM

## 2023-04-27 DIAGNOSIS — Z13220 Encounter for screening for lipoid disorders: Secondary | ICD-10-CM

## 2023-04-27 DIAGNOSIS — F988 Other specified behavioral and emotional disorders with onset usually occurring in childhood and adolescence: Secondary | ICD-10-CM | POA: Diagnosis not present

## 2023-04-27 DIAGNOSIS — E663 Overweight: Secondary | ICD-10-CM | POA: Diagnosis not present

## 2023-04-27 DIAGNOSIS — Z0000 Encounter for general adult medical examination without abnormal findings: Secondary | ICD-10-CM | POA: Diagnosis not present

## 2023-04-27 LAB — CBC WITH DIFFERENTIAL/PLATELET
Basophils Absolute: 0.1 10*3/uL (ref 0.0–0.1)
Basophils Relative: 1.1 % (ref 0.0–3.0)
Eosinophils Absolute: 0.1 10*3/uL (ref 0.0–0.7)
Eosinophils Relative: 0.9 % (ref 0.0–5.0)
HCT: 43.7 % (ref 36.0–46.0)
Hemoglobin: 14.6 g/dL (ref 12.0–15.0)
Lymphocytes Relative: 30.3 % (ref 12.0–46.0)
Lymphs Abs: 1.9 10*3/uL (ref 0.7–4.0)
MCHC: 33.5 g/dL (ref 30.0–36.0)
MCV: 93.1 fL (ref 78.0–100.0)
Monocytes Absolute: 0.3 10*3/uL (ref 0.1–1.0)
Monocytes Relative: 4.4 % (ref 3.0–12.0)
Neutro Abs: 4 10*3/uL (ref 1.4–7.7)
Neutrophils Relative %: 63.3 % (ref 43.0–77.0)
Platelets: 200 10*3/uL (ref 150.0–400.0)
RBC: 4.69 Mil/uL (ref 3.87–5.11)
RDW: 12.8 % (ref 11.5–15.5)
WBC: 6.3 10*3/uL (ref 4.0–10.5)

## 2023-04-27 LAB — COMPREHENSIVE METABOLIC PANEL
ALT: 12 U/L (ref 0–35)
AST: 14 U/L (ref 0–37)
Albumin: 4.5 g/dL (ref 3.5–5.2)
Alkaline Phosphatase: 67 U/L (ref 39–117)
BUN: 13 mg/dL (ref 6–23)
CO2: 28 meq/L (ref 19–32)
Calcium: 9.4 mg/dL (ref 8.4–10.5)
Chloride: 101 meq/L (ref 96–112)
Creatinine, Ser: 0.83 mg/dL (ref 0.40–1.20)
GFR: 89.8 mL/min (ref >60.00)
Glucose, Bld: 79 mg/dL (ref 70–99)
Potassium: 4.6 meq/L (ref 3.5–5.1)
Sodium: 138 meq/L (ref 135–145)
Total Bilirubin: 0.5 mg/dL (ref 0.2–1.2)
Total Protein: 7.2 g/dL (ref 6.0–8.3)

## 2023-04-27 LAB — LIPID PANEL
Cholesterol: 157 mg/dL (ref 0–200)
HDL: 75.2 mg/dL (ref >39.00)
LDL Cholesterol: 76 mg/dL (ref 0–99)
NonHDL: 81.45
Total CHOL/HDL Ratio: 2
Triglycerides: 29 mg/dL (ref 0.0–149.0)
VLDL: 5.8 mg/dL (ref 0.0–40.0)

## 2023-04-27 LAB — HEMOGLOBIN A1C: Hgb A1c MFr Bld: 5.5 % (ref 4.6–6.5)

## 2023-04-27 LAB — VITAMIN D 25 HYDROXY (VIT D DEFICIENCY, FRACTURES): VITD: 50.99 ng/mL (ref 30.00–100.00)

## 2023-04-27 LAB — TSH: TSH: 1.97 u[IU]/mL (ref 0.35–5.50)

## 2023-04-27 MED ORDER — SERTRALINE HCL 100 MG PO TABS: 100.0000 mg | ORAL_TABLET | Freq: Every day | ORAL | 3 refills | Status: DC

## 2023-04-27 NOTE — Assessment & Plan Note (Signed)
Physical exam complete. We will order routine blood work and advise them to continue their current exercise regimen along with their dietary changes. Their next colonoscopy is due in 2028, having last been done in 2023, and we will start screening mammograms at age 37. Pap smear is no longer indicated. Their tetanus shot is up to date, with the last one in 2017. She politely declined the flu vaccine today and has never received any COVID vaccines. They should continue regular dental and eye exams, with the next follow-up in 1 year unless issues arise.

## 2023-04-27 NOTE — Assessment & Plan Note (Signed)
Managed by Psychiatry. They remain stable on current medication regimen. Follow up as scheduled.

## 2023-04-27 NOTE — Assessment & Plan Note (Signed)
They are tolerating Wegovy well six weeks after initiation, with minimal side effects and a decreased appetite, resulting in an 8-pound weight loss. We will continue Wegovy and plan to increase the dose after 2 more weeks on the current dose. They should check in as needed for refills or dose adjustments.

## 2023-04-27 NOTE — Assessment & Plan Note (Signed)
They remain stable on Zoloft 100mg . We will continue the current regimen. Refills sent today. Encouraged to contact if worsening symptoms, unusual behavior changes or suicidal thoughts occur.

## 2023-05-09 ENCOUNTER — Other Ambulatory Visit: Payer: Self-pay

## 2023-05-23 ENCOUNTER — Telehealth: Payer: No Typology Code available for payment source | Admitting: Physician Assistant

## 2023-05-23 DIAGNOSIS — L255 Unspecified contact dermatitis due to plants, except food: Secondary | ICD-10-CM | POA: Diagnosis not present

## 2023-05-23 MED ORDER — PREDNISONE 10 MG (21) PO TBPK: ORAL_TABLET | ORAL | 0 refills | Status: DC

## 2023-05-23 MED ORDER — TRIAMCINOLONE ACETONIDE 0.1 % EX CREA: 1.0000 | TOPICAL_CREAM | Freq: Two times a day (BID) | CUTANEOUS | 0 refills | Status: DC

## 2023-05-23 NOTE — Patient Instructions (Signed)
[name], thank you for joining Margaretann Loveless, PA-C for today's virtual visit.  While this provider is not your primary care provider (PCP), if your PCP is located in our provider database this encounter information will be shared with them immediately following your visit.   A [facility] MyChart account gives you access to today's visit and all your visits, tests, and labs performed at [facility] " click here if you don't have a [facility] MyChart account or go to [email or web address]  Consent: (Patient) [name] provided verbal consent for this virtual visit at the beginning of the encounter.  Current Medications:  Current Outpatient Medications:    predniSONE (STERAPRED UNI-PAK 21 TAB) 10 MG (21) TBPK tablet, 6 day taper; take as directed on package instructions, Disp: 21 tablet, Rfl: 0   triamcinolone cream (KENALOG) 0.1 %, Apply 1 Application topically 2 (two) times daily., Disp: 30 g, Rfl: 0   Amphetamine Sulfate 10 MG TABS, Take 2 tablets by mouth 2 (two) times daily., Disp: , Rfl:    cetirizine (ZYRTEC) 10 MG tablet, TAKE 1 TABLET BY MOUTH EVERY DAY (Patient taking differently: Take 10 mg by mouth daily.), Disp: 30 tablet, Rfl: 3   Multiple Vitamins-Minerals (MULTIVITAMIN WITH MINERALS) tablet, Take 1 tablet by mouth daily., Disp: , Rfl:    Semaglutide-Weight Management (WEGOVY) 0.5 MG/0.5ML SOAJ, Inject 0.5 mg into the skin once a week. X 4 weeks then increase, Disp: 2 mL, Rfl: 0   Semaglutide-Weight Management (WEGOVY) 1 MG/0.5ML SOAJ, Inject 1 mg into the skin once a week., Disp: 2 mL, Rfl: 0   sertraline (ZOLOFT) 100 MG tablet, Take 1 tablet (100 mg total) by mouth daily., Disp: 90 tablet, Rfl: 3   Medications ordered in this encounter:  Meds ordered this encounter  Medications   triamcinolone cream (KENALOG) 0.1 %    Sig: Apply 1 Application topically 2 (two) times daily.    Dispense:  30 g    Refill:  0    Order Specific Question:    Supervising Provider    Answer:   Merrilee Jansky [[id number]]   predniSONE (STERAPRED UNI-PAK 21 TAB) 10 MG (21) TBPK tablet    Sig: 6 day taper; take as directed on package instructions    Dispense:  21 tablet    Refill:  0    Order Specific Question:   Supervising Provider    Answer:   Merrilee Jansky [[id number]]     *If you need refills on other medications prior to your next appointment, please contact your pharmacy*  Follow-Up: Call back or seek an in-person evaluation if the symptoms worsen or if the condition fails to improve as anticipated.  [facility] [phone]  Other Instructions Poison Oak Dermatitis  Poison oak dermatitis is inflammation of the skin that is caused by contact with the chemicals in the leaves of the poison oak (Toxicodendron) plant. The skin reaction often includes redness, swelling, blisters, and extreme itching. What are the causes? This condition is caused by a specific chemical (urushiol) that is found in the sap of the poison oak plant. This chemical is sticky and can be easily spread to people, animals, and objects. You can get poison oak dermatitis by: Having direct contact with a poison oak plant. Touching animals, other people, or objects that have come in contact with poison oak and have the chemical on them. What increases the risk? This condition is more likely to develop in people who:  Are outdoors often in wooded or [place] areas. Go outdoors without wearing protective clothing, such as closed shoes, long pants, and a long-sleeved shirt. What are the signs or symptoms? Symptoms of this condition include: Redness of the skin. Extreme itching. A rash that often includes bumps and blisters. The rash usually appears 48 hours after exposure if you have been exposed before. If this is the first time you have been exposed, the rash may not appear until a week after exposure. Swelling. This may occur if the reaction is more  severe. Symptoms usually last for 1-2 weeks. However, the first time you develop this condition, symptoms may last 3-4 weeks. How is this diagnosed? This condition may be diagnosed based on your symptoms and a physical exam. Your health care provider may also ask you about any recent outdoor activity. How is this treated? Treatment for this condition will vary depending on how severe it is. Treatment may include: Hydrocortisone creams or calamine lotions to relieve itching. Oatmeal baths to soothe the skin. Over-the-counter antihistamine medicines to help reduce itching. Steroid medicine taken by mouth (orally) for more severe reactions. Follow these instructions at home: Medicines Take or apply over-the-counter and prescription medicines only as told by your health care provider. Use hydrocortisone creams or calamine lotion as needed to soothe the skin and relieve itching. General instructions Do not scratch or rub your skin. Apply a cold, wet cloth (cold compress) to the affected areas or take baths in cool water. This will help with itching. Avoid hot baths and showers. Take oatmeal baths as needed. Use colloidal oatmeal. You can get this at your local pharmacy or grocery store. Follow the instructions on the packaging. Wash clothes, bedsheets, towels, and blankets that you wore or came in contact with between your exposure to the plant and the appearance of your rash. The oils can remain on these items and continue to cause new exposure. Check the affected area every day for signs of infection. Check for: More redness, swelling, or pain. Fluid or blood. Warmth. Pus or a bad smell. Keep all follow-up visits. Your health care provider may want to see how your skin is progressing with treatment. How is this prevented?  Learn to identify the poison oak plant and avoid contact with the plant. This plant can be recognized by the number of leaves. Generally, poison oak has three leaves with  flowering branches on a single stem. The leaves are often a bit fuzzy and have a toothlike edge. If you have been exposed to poison oak, thoroughly wash your skin with soap and water right away. You have about 30 minutes to remove the plant resin before it will cause the rash. Be sure to wash under your fingernails because any plant resin there will continue to spread the rash. When hiking or camping, wear clothes that will help you avoid exposure on the skin. This includes long pants, a long-sleeved shirt, long socks, and hiking boots. You can also apply preventive lotion to your skin to help limit exposure. If you suspect that your clothes or outdoor gear came in contact with poison oak, rinse them off outside with a garden hose before bringing them inside your house. When doing yard work or gardening, wear gloves, long sleeves, long pants, and boots. Wash your garden tools and gloves if they come in contact with poison oak. If you suspect that your pet has come into contact with poison oak, wash them with pet shampoo and water. Make sure you  wear gloves while washing your pet. Do not burn poison oak plants. This can release the chemical from the plant into the air and may cause a reaction on the skin or eyes, or in the lungs from breathing in the smoke. Contact a health care provider if: You have open sores in the rash area. You have any signs of infection. You have redness that spreads beyond the rash area. You have a fever. You have a rash over a large area of your body. You have a rash on your eyes, mouth, or genitals. You have a rash that does not improve after a few weeks. Get help right away if: Your face swells or your eyes swell shut. You have trouble breathing. You have trouble swallowing. These symptoms may be an emergency. Get help right away. Call 911. Do not wait to see if the symptoms will go away. Do not drive yourself to the hospital. This information is not intended to  replace advice given to you by your health care provider. Make sure you discuss any questions you have with your health care provider. Document Revised: 12/09/2021 Document Reviewed: 10/29/2021 Elsevier Patient Education  2024 Elsevier Inc.    If you have been instructed to have an in-person evaluation today at a local Urgent Care facility, please use the link below. It will take you to a list of all of our available [facility], including address, phone number and hours of operation. Please do not delay care.  [facility]  If you or a family member do not have a primary care provider, use the link below to schedule a visit and establish care. When you choose a [facility] primary care physician or advanced practice provider, you gain a long-term partner in health. Find a Primary Care Provider  Learn more about [facility]'s in-office and virtual care options:  - Get Care Now

## 2023-05-23 NOTE — Progress Notes (Signed)
Virtual Visit Consent   [name], you are scheduled for a virtual visit with a [facility] provider today. Just as with appointments in the office, your consent must be obtained to participate. Your consent will be active for this visit and any virtual visit you may have with one of our providers in the next 365 days. If you have a MyChart account, a copy of this consent can be sent to you electronically.  As this is a virtual visit, video technology does not allow for your provider to perform a traditional examination. This may limit your provider's ability to fully assess your condition. If your provider identifies any concerns that need to be evaluated in person or the need to arrange testing (such as labs, EKG, etc.), we will make arrangements to do so. Although advances in technology are sophisticated, we cannot ensure that it will always work on either your end or our end. If the connection with a video visit is poor, the visit may have to be switched to a telephone visit. With either a video or telephone visit, we are not always able to ensure that we have a secure connection.  By engaging in this virtual visit, you consent to the provision of healthcare and authorize for your insurance to be billed (if applicable) for the services provided during this visit. Depending on your insurance coverage, you may receive a charge related to this service.  I need to obtain your verbal consent now. Are you willing to proceed with your visit today? [name] has provided verbal consent on 05/23/2023 for a virtual visit (video or telephone). Margaretann Loveless, PA-C  Date: 05/23/2023 8:20 AM  Virtual Visit via Video Note   I, Margaretann Loveless, connected with  [name]  ([id number], 1986-06-14) on 05/23/23 at  8:15 AM EST by a video-enabled telemedicine application and verified that I am speaking with the correct person using two identifiers.  Location: Patient: Virtual Visit Location  Patient: Mobile Provider: Virtual Visit Location Provider: Home Office   I discussed the limitations of evaluation and management by telemedicine and the availability of in person appointments. The patient expressed understanding and agreed to proceed.    History of Present Illness: [name] is a 37 y.o. who identifies as a female who was assigned female at birth, and is being seen today for rash.  HPI: Rash This is a new problem. The current episode started 1 to 4 weeks ago. The problem has been gradually worsening since onset. The rash is diffuse. The rash is characterized by blistering, itchiness and redness. She was exposed to plant contact. Past treatments include anti-itch cream. The treatment provided no relief.     Problems:  Patient Active Problem List   Diagnosis Date Noted   Preventative health care 04/27/2023   Overweight (BMI 25.0-29.9) 04/27/2023   Obesity (BMI 30-39.9) 03/16/2023   Colon cancer screening 04/14/2022   Family history of polyps in the colon 04/14/2022   Adenomyosis 11/12/2020   History of endometriosis 11/12/2020   S/P laparoscopic assisted vaginal hysterectomy (LAVH) 11/03/2020   GAD (generalized anxiety disorder) 09/05/2019   Routine general medical examination at a health care facility 01/26/2016   Attention deficit disorder 11/24/2006   Allergic rhinitis 11/21/2006   OVARIAN CYST 11/21/2006   NEPHROLITHIASIS, HX OF 11/21/2006    Allergies:  Allergies  Allergen Reactions   Fexofenadine-Pseudoephed Er     Sudafed  palpatations, felt allegra stopped working   Pseudoephedrine Palpitations  REACTION: reaction not known   Medications:  Current Outpatient Medications:    predniSONE (STERAPRED UNI-PAK 21 TAB) 10 MG (21) TBPK tablet, 6 day taper; take as directed on package instructions, Disp: 21 tablet, Rfl: 0   triamcinolone cream (KENALOG) 0.1 %, Apply 1 Application topically 2 (two) times daily., Disp: 30 g, Rfl: 0   Amphetamine Sulfate 10  MG TABS, Take 2 tablets by mouth 2 (two) times daily., Disp: , Rfl:    cetirizine (ZYRTEC) 10 MG tablet, TAKE 1 TABLET BY MOUTH EVERY DAY (Patient taking differently: Take 10 mg by mouth daily.), Disp: 30 tablet, Rfl: 3   Multiple Vitamins-Minerals (MULTIVITAMIN WITH MINERALS) tablet, Take 1 tablet by mouth daily., Disp: , Rfl:    Semaglutide-Weight Management (WEGOVY) 0.5 MG/0.5ML SOAJ, Inject 0.5 mg into the skin once a week. X 4 weeks then increase, Disp: 2 mL, Rfl: 0   Semaglutide-Weight Management (WEGOVY) 1 MG/0.5ML SOAJ, Inject 1 mg into the skin once a week., Disp: 2 mL, Rfl: 0   sertraline (ZOLOFT) 100 MG tablet, Take 1 tablet (100 mg total) by mouth daily., Disp: 90 tablet, Rfl: 3  Observations/Objective: Patient is well-developed, well-nourished in no acute distress.  Resting comfortably at home.  Head is normocephalic, atraumatic.  No labored breathing.  Speech is clear and coherent with logical content.  Patient is alert and oriented at baseline.    Assessment and Plan: 1. Dermatitis due to plants, including poison ivy, sumac, and oak - triamcinolone cream (KENALOG) 0.1 %; Apply 1 Application topically 2 (two) times daily.  Dispense: 30 g; Refill: 0 - predniSONE (STERAPRED UNI-PAK 21 TAB) 10 MG (21) TBPK tablet; 6 day taper; take as directed on package instructions  Dispense: 21 tablet; Refill: 0  - Possible plant exposure (poison ivy/poison oak/poison sumac) with rash - Will prescribe Prednisone and Triamcinolone cream - May use topical benadryl cream and/or calamine lotion for itching - Cool compresses - Luke warm to cool showers - Seek in person evaluation if rash continues to spread or if any appear to become infected   Follow Up Instructions: I discussed the assessment and treatment plan with the patient. The patient was provided an opportunity to ask questions and all were answered. The patient agreed with the plan and demonstrated an understanding of the  instructions.  A copy of instructions were sent to the patient via MyChart unless otherwise noted below.    The patient was advised to call back or seek an in-person evaluation if the symptoms worsen or if the condition fails to improve as anticipated.    Margaretann Loveless, PA-C

## 2023-06-01 ENCOUNTER — Telehealth: Payer: No Typology Code available for payment source | Admitting: Physician Assistant

## 2023-06-01 DIAGNOSIS — B029 Zoster without complications: Secondary | ICD-10-CM | POA: Diagnosis not present

## 2023-06-01 MED ORDER — VALACYCLOVIR HCL 1 G PO TABS: 1000.0000 mg | ORAL_TABLET | Freq: Three times a day (TID) | ORAL | 0 refills | Status: AC

## 2023-06-01 NOTE — Patient Instructions (Signed)
[name], thank you for joining [name], PA-C for today's virtual visit.  While this provider is not your primary care provider (PCP), if your PCP is located in our provider database this encounter information will be shared with them immediately following your visit.   A [facility] MyChart account gives you access to today's visit and all your visits, tests, and labs performed at [facility] " click here if you don't have a [place] MyChart account or go to [email or web address]  Consent: (Patient) [name] provided verbal consent for this virtual visit at the beginning of the encounter.  Current Medications:  Current Outpatient Medications:    Amphetamine Sulfate 10 MG TABS, Take 2 tablets by mouth 2 (two) times daily., Disp: , Rfl:    cetirizine (ZYRTEC) 10 MG tablet, TAKE 1 TABLET BY MOUTH EVERY DAY (Patient taking differently: Take 10 mg by mouth daily.), Disp: 30 tablet, Rfl: 3   Multiple Vitamins-Minerals (MULTIVITAMIN WITH MINERALS) tablet, Take 1 tablet by mouth daily., Disp: , Rfl:    predniSONE (STERAPRED UNI-PAK 21 TAB) 10 MG (21) TBPK tablet, 6 day taper; take as directed on package instructions, Disp: 21 tablet, Rfl: 0   Semaglutide-Weight Management (WEGOVY) 0.5 MG/0.5ML SOAJ, Inject 0.5 mg into the skin once a week. X 4 weeks then increase, Disp: 2 mL, Rfl: 0   Semaglutide-Weight Management (WEGOVY) 1 MG/0.5ML SOAJ, Inject 1 mg into the skin once a week., Disp: 2 mL, Rfl: 0   sertraline (ZOLOFT) 100 MG tablet, Take 1 tablet (100 mg total) by mouth daily., Disp: 90 tablet, Rfl: 3   triamcinolone cream (KENALOG) 0.1 %, Apply 1 Application topically 2 (two) times daily., Disp: 30 g, Rfl: 0   Medications ordered in this encounter:  No orders of the defined types were placed in this encounter.    *If you need refills on other medications prior to your next appointment, please contact your pharmacy*  Follow-Up: Call back or seek an  in-person evaluation if the symptoms worsen or if the condition fails to improve as anticipated.   Virtual Care [phone]  Other Instructions Shingles  Shingles, or herpes zoster, is an infection. It gives you a skin rash and blisters. These infected areas may hurt a lot. Shingles only happens if: You've had chickenpox. You've been given a shot called a vaccine to protect you from getting chickenpox. Shingles is rare in this case. What are the causes? Shingles is caused by a germ called the varicella-zoster virus. This is the same germ that causes chickenpox. After you're exposed to the germ, it stays in your body but is dormant. This means it isn't active. Shingles happens if the germ becomes active again. This can happen years after you're first exposed to the germ. What increases the risk? You may be more likely to get shingles if: You're older than 37 years of age. You're under a lot of stress. You have a weak immune system. The immune system is your body's defense system. It may be weak if: You have human immunodeficiency virus (HIV). You have acquired immunodeficiency syndrome (AIDS). You have cancer. You take medicines that weaken your immune system. These include organ transplant medicines. What are the signs or symptoms? The first symptoms of shingles may be itching, tingling, or pain. Your skin may feel like it's burning. A few days or weeks later, you'll get a rash. Here's what you can expect: The rash is likely to be on one side of  your body. The rash may be shaped like a belt or a band. Over time, it will turn into blisters filled with fluid. The blisters will break open and change into scabs. The scabs will dry up in about 2-3 weeks. You may also have: A fever. Chills. A headache. Nausea. How is this diagnosed? Shingles is diagnosed with a skin exam. A sample called a culture may be taken from one of your blisters and sent to a lab. This will show if  you have shingles. How is this treated? The rash may last for several weeks. There's no cure for shingles, but your health care provider may give you medicines. These medicines may: Help with pain. Help with itching. Help with irritation and swelling. Help you get better sooner. Help to prevent long-term problems. If the rash is on your face, you may need to see an eye doctor or an ear, nose, and throat (ENT) doctor. Follow these instructions at home: Medicines Take your medicines only as told by your provider. Put an anti-itch cream or numbing cream on the rash or blisters as told by your provider. Relieving itching and discomfort  To help with itching: Put cold, wet cloths called cold compresses on the rash or blisters. Take a cool bath. Try adding baking soda or dry oatmeal to the water. Do not bathe in hot water. Use calamine lotion on the rash or blisters. You can get this type of lotion at the store. Blister and rash care Keep your rash covered with a loose bandage. Wear loose clothes that don't rub on your rash. Take care of your rash as told by your provider. Make sure you: Wash your hands with soap and water for at least 20 seconds before and after you change your bandage. If you can't use soap and water, use hand sanitizer. Keep your rash and blisters clean by washing them with mild soap and cool water. Change your bandage. Check your rash every day for signs of infection. Check for: More redness, swelling, or pain. Fluid or blood. Warmth. Pus or a bad smell. Do not scratch your rash. Do not pick at your blisters. To help you not scratch: Keep your fingernails clean and cut short. Try to wear gloves or mittens when you sleep. General instructions Rest. Wash your hands often with soap and water for at least 20 seconds. If you can't use soap and water, use hand sanitizer. Washing your hands lowers your chance of getting a skin infection. Your infection can cause  chickenpox in others. If you have blisters that aren't scabs yet, stay away from: Babies. Pregnant people. Children who have eczema. Older people who have organ transplants. People who have a long-term, or chronic, illness. Anyone who hasn't had chickenpox before. Anyone who hasn't gotten the chickenpox vaccine. How is this prevented? Vaccines are the best way to prevent you from getting chickenpox or shingles. Talk with your provider about getting these shots. Where to find more information Centers for Disease Control and Prevention (CDC): TonerPromos.no Contact a health care provider if: Your pain doesn't get better with medicine. Your pain doesn't get better after the rash heals. You have any signs of infection around the rash. Your rash or blisters get worse. You have a fever or chills. Get help right away if: The rash is on your face or nose. You have pain in your face or by your eye. You lose feeling on one side of your face. You have trouble seeing. You have ear pain  or ringing in your ear. This information is not intended to replace advice given to you by your health care provider. Make sure you discuss any questions you have with your health care provider. Document Revised: 07/16/2022 Document Reviewed: 07/16/2022 Elsevier Patient Education  2024 Elsevier Inc.    If you have been instructed to have an in-person evaluation today at a local Urgent Care facility, please use the link below. It will take you to a list of all of our available [facility], including address, phone number and hours of operation. Please do not delay care.  [facility]  If you or a family member do not have a primary care provider, use the link below to schedule a visit and establish care. When you choose a [facility] primary care physician or advanced practice provider, you gain a long-term partner in health. Find a Primary Care Provider  Learn more about [facility]'s  in-office and virtual care options: [place] - Get Care Now

## 2023-06-01 NOTE — Progress Notes (Signed)
Virtual Visit Consent   [name], you are scheduled for a virtual visit with a [facility] provider today. Just as with appointments in the office, your consent must be obtained to participate. Your consent will be active for this visit and any virtual visit you may have with one of our providers in the next 365 days. If you have a MyChart account, a copy of this consent can be sent to you electronically.  As this is a virtual visit, video technology does not allow for your provider to perform a traditional examination. This may limit your provider's ability to fully assess your condition. If your provider identifies any concerns that need to be evaluated in person or the need to arrange testing (such as labs, EKG, etc.), we will make arrangements to do so. Although advances in technology are sophisticated, we cannot ensure that it will always work on either your end or our end. If the connection with a video visit is poor, the visit may have to be switched to a telephone visit. With either a video or telephone visit, we are not always able to ensure that we have a secure connection.  By engaging in this virtual visit, you consent to the provision of healthcare and authorize for your insurance to be billed (if applicable) for the services provided during this visit. Depending on your insurance coverage, you may receive a charge related to this service.  I need to obtain your verbal consent now. Are you willing to proceed with your visit today? [name] has provided verbal consent on 06/01/2023 for a virtual visit (video or telephone). [name], [place]  Date: 06/01/2023 7:11 PM  Virtual Visit via Video Note   I, [name], connected with  FREYA RADERMACHER  ([id number], [birth year]) on 06/01/23 at  7:00 PM EST by a video-enabled telemedicine application and verified that I am speaking with the correct person using two identifiers.  Location: Patient: Virtual Visit Location  Patient: Home Provider: Virtual Visit Location Provider: Home Office   I discussed the limitations of evaluation and management by telemedicine and the availability of in person appointments. The patient expressed understanding and agreed to proceed.    History of Present Illness: [name] is a 37 y.o. who identifies as a female who was assigned female at birth, and is being seen today for continued rash. Notes initially seen vai e-visit a week or so ago for suspected poison ivy on her abdomen. Was treated with steroids without much improvement. Now over past few days noting new area on the left side of her abdomen and down the thigh with burning, stinging rash. Notes allodynia and hyperesthesia in these areas. Denies fever, chills. Denies changes in soaps, lotions or detergents. Denies known sick contact.    HPI: HPI  Problems:  Patient Active Problem List   Diagnosis Date Noted   Preventative health care 04/27/2023   Overweight (BMI 25.0-29.9) 04/27/2023   Obesity (BMI 30-39.9) 03/16/2023   Colon cancer screening 04/14/2022   Family history of polyps in the colon 04/14/2022   Adenomyosis 11/12/2020   History of endometriosis 11/12/2020   S/P laparoscopic assisted vaginal hysterectomy (LAVH) 11/03/2020   GAD (generalized anxiety disorder) 09/05/2019   Routine general medical examination at a health care facility 01/26/2016   Attention deficit disorder 11/24/2006   Allergic rhinitis 11/21/2006   OVARIAN CYST 11/21/2006   NEPHROLITHIASIS, HX OF 11/21/2006    Allergies:  Allergies  Allergen Reactions  Fexofenadine-Pseudoephed Er     Sudafed  palpatations, felt allegra stopped working   Pseudoephedrine Palpitations    REACTION: reaction not known   Medications:  Current Outpatient Medications:    valACYclovir (VALTREX) 1000 MG tablet, Take 1 tablet (1,000 mg total) by mouth 3 (three) times daily for 7 days., Disp: 21 tablet, Rfl: 0   Amphetamine Sulfate 10 MG TABS, Take 2  tablets by mouth 2 (two) times daily., Disp: , Rfl:    cetirizine (ZYRTEC) 10 MG tablet, TAKE 1 TABLET BY MOUTH EVERY DAY (Patient taking differently: Take 10 mg by mouth daily.), Disp: 30 tablet, Rfl: 3   Multiple Vitamins-Minerals (MULTIVITAMIN WITH MINERALS) tablet, Take 1 tablet by mouth daily., Disp: , Rfl:    Semaglutide-Weight Management (WEGOVY) 0.5 MG/0.5ML SOAJ, Inject 0.5 mg into the skin once a week. X 4 weeks then increase, Disp: 2 mL, Rfl: 0   Semaglutide-Weight Management (WEGOVY) 1 MG/0.5ML SOAJ, Inject 1 mg into the skin once a week., Disp: 2 mL, Rfl: 0   sertraline (ZOLOFT) 100 MG tablet, Take 1 tablet (100 mg total) by mouth daily., Disp: 90 tablet, Rfl: 3   triamcinolone cream (KENALOG) 0.1 %, Apply 1 Application topically 2 (two) times daily., Disp: 30 g, Rfl: 0  Observations/Objective: Patient is well-developed, well-nourished in no acute distress.  Resting comfortably  at home.  Head is normocephalic, atraumatic.  No labored breathing.  Speech is clear and coherent with logical content.  Patient is alert and oriented at baseline.  Clustered papulovesicular lesions of left side of lower abdomen and side, moving down the leg.   Assessment and Plan: 1. Herpes zoster without complication (Primary) - valACYclovir (VALTREX) 1000 MG tablet; Take 1 tablet (1,000 mg total) by mouth 3 (three) times daily for 7 days.  Dispense: 21 tablet; Refill: 0  Giving current symptoms, concern for shingles. Question if prior rash that is scabbed and starting to heal was the same. She needs follow-up with PCP for further assessment. Will start Valtrex 1000 mg TID. Supportive measures and OTC medications reviewed.   Follow Up Instructions: I discussed the assessment and treatment plan with the patient. The patient was provided an opportunity to ask questions and all were answered. The patient agreed with the plan and demonstrated an understanding of the instructions.  A copy of instructions  were sent to the patient via MyChart unless otherwise noted below.   The patient was advised to call back or seek an in-person evaluation if the symptoms worsen or if the condition fails to improve as anticipated.    Kristy Climes, PA-C

## 2023-06-09 ENCOUNTER — Other Ambulatory Visit: Payer: Self-pay | Admitting: Nurse Practitioner

## 2023-06-09 ENCOUNTER — Other Ambulatory Visit: Payer: Self-pay

## 2023-06-09 DIAGNOSIS — E669 Obesity, unspecified: Secondary | ICD-10-CM

## 2023-06-09 MED ORDER — WEGOVY 2.4 MG/0.75ML ~~LOC~~ SOAJ
2.4000 mg | SUBCUTANEOUS | 2 refills | Status: DC
  Filled 2023-06-09: qty 3, fill #0
  Filled 2023-07-07: qty 3, 28d supply, fill #0
  Filled 2023-07-30: qty 3, 28d supply, fill #1
  Filled 2023-08-29: qty 3, 28d supply, fill #2

## 2023-06-09 MED ORDER — WEGOVY 1.7 MG/0.75ML ~~LOC~~ SOAJ
1.7000 mg | SUBCUTANEOUS | 0 refills | Status: DC
  Filled 2023-06-09: qty 3, 28d supply, fill #0

## 2023-07-07 ENCOUNTER — Other Ambulatory Visit: Payer: Self-pay

## 2023-09-23 ENCOUNTER — Other Ambulatory Visit (HOSPITAL_COMMUNITY): Payer: Self-pay

## 2023-09-23 ENCOUNTER — Telehealth: Payer: Self-pay

## 2023-09-23 NOTE — Telephone Encounter (Signed)
 PA request has been Started. New Encounter has been or will be created for follow up. For additional info see Pharmacy Prior Auth telephone encounter from 09/23/2023.

## 2023-09-23 NOTE — Telephone Encounter (Signed)
 Pharmacy Patient Advocate Encounter   Received notification from Patient Advice Request messages that prior authorization for Wegovy 2.4MG /0.75ML auto-injectors is required/requested.   Insurance verification completed.   The patient is insured through [facility] .   Per test claim: PA required; PA started via CoverMyMeds. KEY [id number] . Waiting for clinical questions to populate.

## 2023-09-27 ENCOUNTER — Other Ambulatory Visit: Payer: Self-pay

## 2023-09-27 ENCOUNTER — Encounter: Payer: Self-pay | Admitting: Nurse Practitioner

## 2023-09-27 ENCOUNTER — Other Ambulatory Visit: Payer: Self-pay | Admitting: Nurse Practitioner

## 2023-09-27 DIAGNOSIS — E669 Obesity, unspecified: Secondary | ICD-10-CM

## 2023-09-28 ENCOUNTER — Encounter: Payer: Self-pay | Admitting: Nurse Practitioner

## 2023-09-28 ENCOUNTER — Other Ambulatory Visit: Payer: Self-pay

## 2023-09-28 ENCOUNTER — Ambulatory Visit: Admitting: Nurse Practitioner

## 2023-09-28 ENCOUNTER — Other Ambulatory Visit (HOSPITAL_COMMUNITY): Payer: Self-pay

## 2023-09-28 VITALS — BP 90/61 | HR 98 | Temp 97.9°F | Ht 64.0 in | Wt 139.2 lb

## 2023-09-28 DIAGNOSIS — Z8639 Personal history of other endocrine, nutritional and metabolic disease: Secondary | ICD-10-CM | POA: Diagnosis not present

## 2023-09-28 DIAGNOSIS — Z6823 Body mass index (BMI) 23.0-23.9, adult: Secondary | ICD-10-CM | POA: Diagnosis not present

## 2023-09-28 NOTE — Telephone Encounter (Signed)
 Pharmacy Patient Advocate Encounter  Received notification from [facility] that Prior Authorization for [facility] 2.4MG /0.75ML auto-injectors has been APPROVED from 09/28/23 to 09/27/24. Ran test claim, Copay is $24.99. This test claim was processed through [facility]- copay amounts may vary at other pharmacies due to pharmacy/plan contracts, or as the patient moves through the different stages of their insurance plan.   PA #/Case ID/Reference #: [id number]    Patient can also fill 1.7MG  if needed.

## 2023-09-28 NOTE — Telephone Encounter (Signed)
 Clinical questions have been answered and PA submitted. PA currently Pending.

## 2023-09-28 NOTE — Assessment & Plan Note (Signed)
 She achieved significant weight loss on Wegovy, reducing her BMI to 23. She no longer qualifies for [facility] due to her current BMI and absence of comorbidities. Discontinue Wegovy and focus on weight maintenance through lifestyle changes, including diet and exercise. Emphasize the importance of hydration and monitoring blood pressure.

## 2023-09-28 NOTE — Progress Notes (Signed)
 Kristy Dicker, NP-C Phone: [phone]  [name] is a 38 y.o. female who presents today for weight management.   Discussed the use of AI scribe software for clinical note transcription with the patient, who gave verbal consent to proceed.  History of Present Illness   [name] is a 38 year old female who presents for follow-up regarding weight management on Wegovy.  She has been on [facility] for six months and has lost 37 pounds, reducing her BMI from over 30 to 23. She has not experienced any side effects such as nausea or vomiting. Her current BMI is 23, and she is pleased with her progress.  Her diet includes a morning routine of either an egg or a protein shake, along with fruits like bananas, grapes, or strawberries. For lunch, she typically has a grilled chicken salad. She exercises by walking three to four times a week and has been focusing on strength training over the past month.  Her goal weight is 130 pounds, and she is currently working on maintaining her weight through diet and exercise as she no longer qualifies for [facility] due to her BMI being below the threshold for continued medication use.  She feels well-hydrated, and her blood pressure has been consistently low, though it is lower than usual today. No dizziness or lightheadedness.      Social History   Tobacco Use  Smoking Status Never  Smokeless Tobacco Never    Current Outpatient Medications on File Prior to Visit  Medication Sig Dispense Refill   Amphetamine Sulfate 10 MG TABS Take 2 tablets by mouth 2 (two) times daily.     cetirizine (ZYRTEC) 10 MG tablet TAKE 1 TABLET BY MOUTH EVERY DAY (Patient taking differently: Take 10 mg by mouth daily.) 30 tablet 3   Multiple Vitamins-Minerals (MULTIVITAMIN WITH MINERALS) tablet Take 1 tablet by mouth daily.     Semaglutide-Weight Management (WEGOVY) 1.7 MG/0.75ML SOAJ Inject 1.7 mg into the skin once a week. 3 mL 0   Semaglutide-Weight Management (WEGOVY) 2.4  MG/0.75ML SOAJ Inject 2.4 mg into the skin once a week. 3 mL 2   sertraline (ZOLOFT) 100 MG tablet Take 1 tablet (100 mg total) by mouth daily. 90 tablet 3   triamcinolone cream (KENALOG) 0.1 % Apply 1 Application topically 2 (two) times daily. 30 g 0   No current facility-administered medications on file prior to visit.     ROS see history of present illness  Objective  Physical Exam Vitals:   09/28/23 1104  BP: 90/61  Pulse: 98  Temp: 97.9 F (36.6 C)  SpO2: 95%    BP Readings from Last 3 Encounters:  09/28/23 90/61  04/27/23 116/70  03/16/23 106/60   Wt Readings from Last 3 Encounters:  09/28/23 139 lb 3.2 oz (63.1 kg)  04/27/23 168 lb 12.8 oz (76.6 kg)  03/16/23 176 lb 12.8 oz (80.2 kg)    Physical Exam Constitutional:      General: She is not in acute distress.    Appearance: Normal appearance.  HENT:     Head: Normocephalic.  Cardiovascular:     Rate and Rhythm: Normal rate and regular rhythm.     Heart sounds: Normal heart sounds.  Pulmonary:     Effort: Pulmonary effort is normal.     Breath sounds: Normal breath sounds.  Skin:    General: Skin is warm and dry.  Neurological:     General: No focal deficit present.     Mental Status: She is  alert.  Psychiatric:        Mood and Affect: Mood normal.        Behavior: Behavior normal.     Assessment/Plan: Please see individual problem list.  History of obesity Assessment & Plan: She achieved significant weight loss on Wegovy, reducing her BMI to 23. She no longer qualifies for [facility] due to her current BMI and absence of comorbidities. Discontinue Wegovy and focus on weight maintenance through lifestyle changes, including diet and exercise. Emphasize the importance of hydration and monitoring blood pressure.   BMI 23.0-23.9, adult   Return in about 6 months (around 03/20/2024) for Annual Exam, sooner as needed.   Bluford Burkitt, NP-C [facility]

## 2023-10-18 ENCOUNTER — Other Ambulatory Visit: Payer: Self-pay | Admitting: Nurse Practitioner

## 2023-10-18 ENCOUNTER — Other Ambulatory Visit: Payer: Self-pay

## 2023-10-18 MED ORDER — WEGOVY 1.7 MG/0.75ML ~~LOC~~ SOAJ
1.7000 mg | SUBCUTANEOUS | 0 refills | Status: DC
  Filled 2023-10-18: qty 3, 28d supply, fill #0
  Filled 2023-11-11: qty 3, 28d supply, fill #1
  Filled 2023-12-20: qty 3, 28d supply, fill #2

## 2023-10-18 MED ORDER — WEGOVY 1.7 MG/0.75ML ~~LOC~~ SOAJ: 1.7000 mg | SUBCUTANEOUS | 0 refills | Status: DC

## 2024-01-19 ENCOUNTER — Other Ambulatory Visit: Payer: Self-pay | Admitting: Nurse Practitioner

## 2024-01-19 ENCOUNTER — Other Ambulatory Visit: Payer: Self-pay

## 2024-01-19 MED FILL — Semaglutide (Weight Mngmt) Soln Auto-Injector 1.7 MG/0.75ML: SUBCUTANEOUS | 28 days supply | Qty: 3 | Fill #0 | Status: AC

## 2024-02-28 MED FILL — Semaglutide (Weight Mngmt) Soln Auto-Injector 1.7 MG/0.75ML: SUBCUTANEOUS | 28 days supply | Qty: 3 | Fill #1 | Status: AC

## 2024-03-31 MED FILL — Semaglutide (Weight Mngmt) Soln Auto-Injector 1.7 MG/0.75ML: SUBCUTANEOUS | 28 days supply | Qty: 3 | Fill #2 | Status: AC

## 2024-05-01 ENCOUNTER — Ambulatory Visit: Payer: No Typology Code available for payment source | Admitting: Nurse Practitioner

## 2024-05-01 ENCOUNTER — Encounter: Payer: Self-pay | Admitting: Nurse Practitioner

## 2024-05-01 VITALS — BP 109/76 | HR 71 | Temp 97.7°F | Ht 64.0 in | Wt 126.2 lb

## 2024-05-01 DIAGNOSIS — E669 Obesity, unspecified: Secondary | ICD-10-CM

## 2024-05-01 DIAGNOSIS — Z6821 Body mass index (BMI) 21.0-21.9, adult: Secondary | ICD-10-CM | POA: Diagnosis not present

## 2024-05-01 DIAGNOSIS — Z13220 Encounter for screening for lipoid disorders: Secondary | ICD-10-CM

## 2024-05-01 DIAGNOSIS — Z0000 Encounter for general adult medical examination without abnormal findings: Secondary | ICD-10-CM

## 2024-05-01 DIAGNOSIS — F411 Generalized anxiety disorder: Secondary | ICD-10-CM | POA: Diagnosis not present

## 2024-05-01 DIAGNOSIS — Z1329 Encounter for screening for other suspected endocrine disorder: Secondary | ICD-10-CM | POA: Diagnosis not present

## 2024-05-01 LAB — COMPREHENSIVE METABOLIC PANEL WITH GFR
ALT: 9 U/L (ref 0–35)
AST: 11 U/L (ref 0–37)
Albumin: 4.5 g/dL (ref 3.5–5.2)
Alkaline Phosphatase: 50 U/L (ref 39–117)
BUN: 9 mg/dL (ref 6–23)
CO2: 27 meq/L (ref 19–32)
Calcium: 9.3 mg/dL (ref 8.4–10.5)
Chloride: 104 meq/L (ref 96–112)
Creatinine, Ser: 0.88 mg/dL (ref 0.40–1.20)
GFR: 83.12 mL/min (ref >60.00)
Glucose, Bld: 76 mg/dL (ref 70–99)
Potassium: 4.8 meq/L (ref 3.5–5.1)
Sodium: 139 meq/L (ref 135–145)
Total Bilirubin: 0.5 mg/dL (ref 0.2–1.2)
Total Protein: 6.6 g/dL (ref 6.0–8.3)

## 2024-05-01 LAB — VITAMIN D 25 HYDROXY (VIT D DEFICIENCY, FRACTURES): VITD: 42.08 ng/mL (ref 30.00–100.00)

## 2024-05-01 LAB — LIPID PANEL
Cholesterol: 174 mg/dL (ref 0–200)
HDL: 85.9 mg/dL (ref >39.00)
LDL Cholesterol: 82 mg/dL (ref 0–99)
NonHDL: 87.65
Total CHOL/HDL Ratio: 2
Triglycerides: 26 mg/dL (ref 0.0–149.0)
VLDL: 5.2 mg/dL (ref 0.0–40.0)

## 2024-05-01 LAB — TSH: TSH: 1.44 u[IU]/mL (ref 0.35–5.50)

## 2024-05-01 MED ORDER — WEGOVY 1.7 MG/0.75ML ~~LOC~~ SOAJ
1.7000 mg | SUBCUTANEOUS | 3 refills | Status: AC
  Filled 2024-05-24: qty 3, 28d supply, fill #0
  Filled 2024-06-10 – 2024-06-25 (×2): qty 3, 28d supply, fill #1

## 2024-05-01 MED ORDER — WEGOVY 1.7 MG/0.75ML ~~LOC~~ SOAJ: 1.7000 mg | SUBCUTANEOUS | 0 refills | Status: DC

## 2024-05-01 MED ORDER — SERTRALINE HCL 100 MG PO TABS: 100.0000 mg | ORAL_TABLET | Freq: Every day | ORAL | 3 refills | Status: AC

## 2024-05-01 NOTE — Progress Notes (Signed)
 Leron Glance, NP-C Phone: [phone]  [name] is a 38 y.o. female who presents today for annual exam.   Discussed the use of AI scribe software for clinical note transcription with the patient, who gave verbal consent to proceed.  History of Present Illness   [name] is a 38 year old female who presents for an annual physical exam.  She has no new problems or concerns since her last visit. She has been using Wegovy  every other week to maintain her weight and is satisfied with her current weight, having lost 42 pounds over the past year. Her diet consists of smaller portions, including a protein bar or yogurt for breakfast and a grilled chicken salad with fruit for lunch. She engages in walking as her primary form of exercise.  She had a colonoscopy in 2023 and is due for her next one in 2028. She has a personal history of polyps but no family history of colon cancer. She had a hysterectomy. Her grandmother had breast cancer, but there is no family history of ovarian cancer. She has one ovary remaining.  She does not smoke, rarely drinks alcohol, and does not use drugs. She regularly visits the dentist and eye doctor.  She continues to take Zoloft  and feels it is effective for her. No chest pain, shortness of breath, abdominal pain, urinary symptoms, headaches, dizziness, swallowing difficulties, skin changes, swelling, joint pain, and mood issues. She reports good sleep but notes significant hair thinning and loss, which she attributes to hard water .      Social History   Tobacco Use  Smoking Status Never  Smokeless Tobacco Never    Current Outpatient Medications on File Prior to Visit  Medication Sig Dispense Refill   Amphetamine  Sulfate 10 MG TABS Take 2 tablets by mouth 2 (two) times daily.     cetirizine  (ZYRTEC ) 10 MG tablet TAKE 1 TABLET BY MOUTH EVERY DAY (Patient taking differently: Take 10 mg by mouth daily.) 30 tablet 3   Multiple Vitamins-Minerals  (MULTIVITAMIN WITH MINERALS) tablet Take 1 tablet by mouth daily.     No current facility-administered medications on file prior to visit.     ROS see history of present illness  Objective  Physical Exam Vitals:   05/01/24 0818  BP: 109/76  Pulse: 71  Temp: 97.7 F (36.5 C)  SpO2: 98%    BP Readings from Last 3 Encounters:  05/01/24 109/76  09/28/23 90/61  04/27/23 116/70   Wt Readings from Last 3 Encounters:  05/01/24 126 lb 3.2 oz (57.2 kg)  09/28/23 139 lb 3.2 oz (63.1 kg)  04/27/23 168 lb 12.8 oz (76.6 kg)    Physical Exam Constitutional:      General: She is not in acute distress.    Appearance: Normal appearance.  HENT:     Head: Normocephalic.     Right Ear: Tympanic membrane normal.     Left Ear: Tympanic membrane normal.     Nose: Nose normal.     Mouth/Throat:     Mouth: Mucous membranes are moist.     Pharynx: Oropharynx is clear.  Eyes:     Conjunctiva/sclera: Conjunctivae normal.     Pupils: Pupils are equal, round, and reactive to light.  Neck:     Thyroid : No thyromegaly.  Cardiovascular:     Rate and Rhythm: Normal rate and regular rhythm.     Heart sounds: Normal heart sounds.  Pulmonary:     Effort: Pulmonary effort is normal.  Breath sounds: Normal breath sounds.  Abdominal:     General: Abdomen is flat. Bowel sounds are normal.     Palpations: Abdomen is soft. There is no mass.     Tenderness: There is no abdominal tenderness.  Musculoskeletal:        General: Normal range of motion.  Lymphadenopathy:     Cervical: No cervical adenopathy.  Skin:    General: Skin is warm and dry.     Findings: No rash.  Neurological:     General: No focal deficit present.     Mental Status: She is alert.  Psychiatric:        Mood and Affect: Mood normal.        Behavior: Behavior normal.      Assessment/Plan: Please see individual problem list.  Routine general medical examination at a health care facility Assessment & Plan: Physical  exam complete. We will check lab work as outlined. Colonoscopy is up to date and we will start screening mammograms at age 54. Pap smear is no longer indicated, s/p hysterectomy. Her tetanus shot is up to date. She politely declined the flu vaccine today and has never received any COVID vaccines. Continue routine dental and eye exams. Encourage healthy diet and regular exercise. Return to care in one year, sooner as needed.   Orders: -     Comprehensive metabolic panel with GFR  GAD (generalized anxiety disorder) Assessment & Plan: Her anxiety is well-managed on Zoloft  100 mg daily. Continue Zoloft . Encouraged to contact if worsening symptoms, unusual behavior changes or suicidal thoughts occur.   Orders: -     Sertraline  HCl; Take 1 tablet (100 mg total) by mouth daily.  Dispense: 90 tablet; Refill: 3 -     VITAMIN D  25 Hydroxy (Vit-D Deficiency, Fractures)  Obesity (BMI 30-39.9) Assessment & Plan: Weight is well-managed with Wegovy  and she maintains a healthy BMI. She has lost 42 pounds over the past year. Diet and exercise regimen are effective. Continue Wegovy , healthy diet and regular exercise.   Orders: -     Wegovy ; Inject 1.7 mg into the skin once a week.  Dispense: 9 mL; Refill: 3  BMI 21.0-21.9, adult  Thyroid  disorder screen -     TSH  Lipid screening -     Lipid panel     Return in about 1 year (around 05/01/2025) for Annual Exam, sooner as needed.   Leron Glance, NP-C [facility]

## 2024-05-08 ENCOUNTER — Ambulatory Visit: Payer: Self-pay | Admitting: Nurse Practitioner

## 2024-05-16 ENCOUNTER — Encounter: Payer: Self-pay | Admitting: Nurse Practitioner

## 2024-05-16 NOTE — Assessment & Plan Note (Signed)
 Physical exam complete. We will check lab work as outlined. Colonoscopy is up to date and we will start screening mammograms at age 38. Pap smear is no longer indicated, s/p hysterectomy. Her tetanus shot is up to date. She politely declined the flu vaccine today and has never received any COVID vaccines. Continue routine dental and eye exams. Encourage healthy diet and regular exercise. Return to care in one year, sooner as needed.

## 2024-05-16 NOTE — Assessment & Plan Note (Signed)
 Weight is well-managed with Wegovy  and she maintains a healthy BMI. She has lost 42 pounds over the past year. Diet and exercise regimen are effective. Continue Wegovy , healthy diet and regular exercise.

## 2024-05-16 NOTE — Assessment & Plan Note (Signed)
 Her anxiety is well-managed on Zoloft  100 mg daily. Continue Zoloft . Encouraged to contact if worsening symptoms, unusual behavior changes or suicidal thoughts occur.

## 2024-05-24 ENCOUNTER — Other Ambulatory Visit: Payer: Self-pay

## 2024-05-25 ENCOUNTER — Other Ambulatory Visit: Payer: Self-pay

## 2024-06-11 ENCOUNTER — Other Ambulatory Visit: Payer: Self-pay

## 2024-06-25 ENCOUNTER — Other Ambulatory Visit: Payer: Self-pay

## 2024-06-28 ENCOUNTER — Other Ambulatory Visit: Payer: Self-pay

## 2025-05-07 ENCOUNTER — Encounter: Admitting: Nurse Practitioner
